# Patient Record
Sex: Female | Born: 1995 | Race: Black or African American | Hispanic: No | Marital: Single | Smoking: Never smoker
Health system: Southern US, Community
[De-identification: ages and names within clinical notes are randomized; demographics above are authoritative.]

## PROBLEM LIST (undated history)

## (undated) ENCOUNTER — Inpatient Hospital Stay (HOSPITAL_COMMUNITY): Payer: Self-pay

## (undated) DIAGNOSIS — T7840XA Allergy, unspecified, initial encounter: Secondary | ICD-10-CM

## (undated) HISTORY — DX: Allergy, unspecified, initial encounter: T78.40XA

---
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
## 2014-07-03 ENCOUNTER — Emergency Department (INDEPENDENT_AMBULATORY_CARE_PROVIDER_SITE_OTHER)
Admission: EM | Admit: 2014-07-03 | Discharge: 2014-07-03 | Disposition: A | Discharge status: Home / Self Care | Payer: 59 | Source: Home / Self Care | Attending: Family Medicine | Admitting: Family Medicine

## 2014-07-03 ENCOUNTER — Encounter (HOSPITAL_COMMUNITY): Payer: Self-pay | Admitting: Emergency Medicine

## 2014-07-03 DIAGNOSIS — J302 Other seasonal allergic rhinitis: Secondary | ICD-10-CM

## 2014-07-03 DIAGNOSIS — J040 Acute laryngitis: Secondary | ICD-10-CM | POA: Diagnosis not present

## 2014-07-03 DIAGNOSIS — I889 Nonspecific lymphadenitis, unspecified: Secondary | ICD-10-CM | POA: Diagnosis not present

## 2014-07-03 MED ORDER — AMOXICILLIN-POT CLAVULANATE 875-125 MG PO TABS: 1.0000 | ORAL_TABLET | Freq: Two times a day (BID) | ORAL | Status: DC

## 2014-07-03 NOTE — ED Notes (Signed)
C/o  Pain in left side neck.  States the following day having swelling and loss of voice.  Denies fever, n/v/d.   No relief with otc pain meds.   Symptoms present x 2 wks.

## 2014-07-03 NOTE — Discharge Instructions (Signed)
Allergic Rhinitis Allegra 60 mg twice a day for drainage Flonase or rhinocort nasal spray. Lots of oral liquids Allergic rhinitis is when the mucous membranes in the nose respond to allergens. Allergens are particles in the air that cause your body to have an allergic reaction. This causes you to release allergic antibodies. Through a chain of events, these eventually cause you to release histamine into the blood stream. Although meant to protect the body, it is this release of histamine that causes your discomfort, such as frequent sneezing, congestion, and an itchy, runny nose.  CAUSES  Seasonal allergic rhinitis (hay fever) is caused by pollen allergens that may come from grasses, trees, and weeds. Year-round allergic rhinitis (perennial allergic rhinitis) is caused by allergens such as house dust mites, pet dander, and mold spores.  SYMPTOMS   Nasal stuffiness (congestion).  Itchy, runny nose with sneezing and tearing of the eyes. DIAGNOSIS  Your health care provider can help you determine the allergen or allergens that trigger your symptoms. If you and your health care provider are unable to determine the allergen, skin or blood testing may be used. TREATMENT  Allergic rhinitis does not have a cure, but it can be controlled by:  Medicines and allergy shots (immunotherapy).  Avoiding the allergen. Hay fever may often be treated with antihistamines in pill or nasal spray forms. Antihistamines block the effects of histamine. There are over-the-counter medicines that may help with nasal congestion and swelling around the eyes. Check with your health care provider before taking or giving this medicine.  If avoiding the allergen or the medicine prescribed do not work, there are many new medicines your health care provider can prescribe. Stronger medicine may be used if initial measures are ineffective. Desensitizing injections can be used if medicine and avoidance does not work. Desensitization  is when a patient is given ongoing shots until the body becomes less sensitive to the allergen. Make sure you follow up with your health care provider if problems continue. HOME CARE INSTRUCTIONS It is not possible to completely avoid allergens, but you can reduce your symptoms by taking steps to limit your exposure to them. It helps to know exactly what you are allergic to so that you can avoid your specific triggers. SEEK MEDICAL CARE IF:   You have a fever.  You develop a cough that does not stop easily (persistent).  You have shortness of breath.  You start wheezing.  Symptoms interfere with normal daily activities. Document Released: 12/05/2000 Document Revised: 03/17/2013 Document Reviewed: 11/17/2012 [facility] Patient Information 2015 [place], [place]. This information is not intended to replace advice given to you by your health care provider. Make sure you discuss any questions you have with your health care provider.  Cervical Adenitis Heat Ibuprofen 400 mg every 6 hours as needed You have a swollen lymph gland in your neck. This commonly happens with Strep and virus infections, dental problems, insect bites, and injuries about the face, scalp, or neck. The lymph glands swell as the body fights the infection or heals the injury. Swelling and firmness typically lasts for several weeks after the infection or injury is healed. Rarely lymph glands can become swollen because of cancer or TB. Antibiotics are prescribed if there is evidence of an infection. Sometimes an infected lymph gland becomes filled with pus. This condition may require opening up the abscessed gland by draining it surgically. Most of the time infected glands return to normal within two weeks. Do not poke or squeeze the swollen  lymph nodes. That may keep them from shrinking back to their normal size. If the lymph gland is still swollen after 2 weeks, further medical evaluation is needed.  SEEK IMMEDIATE MEDICAL CARE IF:    You have difficulty swallowing or breathing, increased swelling, severe pain, or a high fever.  Document Released: 03/12/2005 Document Revised: 06/04/2011 Document Reviewed: 09/01/2006 [facility] Patient Information 2015 [place], [place]. This information is not intended to replace advice given to you by your health care provider. Make sure you discuss any questions you have with your health care provider.  Laryngitis Laryngitis is redness, soreness, and puffiness (inflammation) of the vocal cords. It causes hoarseness, cough, loss of voice, sore throat, and dry throat. It may be caused by:  Infection.  Too much smoking.  Too much talking or yelling.  Breathing in of toxic fumes.  Allergies.  A backup of acid from your stomach. HOME CARE  Drink enough fluids to keep your pee (urine) clear or pale yellow.  Rest until you no longer have problems or as told by your doctor.  Breathe in moist air.  Take all medicine as told by your doctor.  Do not smoke.  Talk as little as possible (this includes whispering).  Write on paper instead of talking until your voice is back to normal.  Follow up with your doctor if you have not improved after 10 days. GET HELP IF:   You have trouble breathing.  You cough up blood.  You have a fever that will not go away.  You have increasing pain.  You have trouble swallowing. MAKE SURE YOU:  Understand these instructions.  Will watch your condition.  Will get help right away if you are not doing well or get worse. Document Released: 03/01/2011 Document Revised: 06/04/2011 Document Reviewed: 03/01/2011 [facility] Patient Information 2015 [place], [place]. This information is not intended to replace advice given to you by your health care provider. Make sure you discuss any questions you have with your health care provider.  Laryngitis At the top of your windpipe is your voice box. It is the source of your voice. Inside your voice box are 2  bands of muscles called vocal cords. When you breathe, your vocal cords are relaxed and open so that air can get into the lungs. When you decide to say something, these cords come together and vibrate. The sound from these vibrations goes into your throat and comes out through your mouth as sound. Laryngitis is an inflammation of the vocal cords that causes hoarseness, cough, loss of voice, sore throat, and dry throat. Laryngitis can be temporary (acute) or long-term (chronic). Most cases of acute laryngitis improve with time.Chronic laryngitis lasts for more than 3 weeks. CAUSES Laryngitis can often be related to excessive smoking, talking, or yelling, as well as inhalation of toxic fumes and allergies. Acute laryngitis is usually caused by a viral infection, vocal strain, measles or mumps, or bacterial infections. Chronic laryngitis is usually caused by vocal cord strain, vocal cord injury, postnasal drip, growths on the vocal cords, or acid reflux. SYMPTOMS   Cough.  Sore throat.  Dry throat. RISK FACTORS  Respiratory infections.  Exposure to irritating substances, such as cigarette smoke, excessive amounts of alcohol, stomach acids, and workplace chemicals.  Voice trauma, such as vocal cord injury from shouting or speaking too loud. DIAGNOSIS  Your cargiver will perform a physical exam. During the physical exam, your caregiver will examine your throat. The most common sign of laryngitis is hoarseness. Laryngoscopy  may be necessary to confirm the diagnosis of this condition. This procedure allows your caregiver to look into the larynx. HOME CARE INSTRUCTIONS  Drink enough fluids to keep your urine clear or pale yellow.  Rest until you no longer have symptoms or as directed by your caregiver.  Breathe in moist air.  Take all medicine as directed by your caregiver.  Do not smoke.  Talk as little as possible (this includes whispering).  Write on paper instead of talking until your  voice is back to normal.  Follow up with your caregiver if your condition has not improved after 10 days. SEEK MEDICAL CARE IF:   You have trouble breathing.  You cough up blood.  You have persistent fever.  You have increasing pain.  You have difficulty swallowing. MAKE SURE YOU:  Understand these instructions.  Will watch your condition.  Will get help right away if you are not doing well or get worse. Document Released: 03/12/2005 Document Revised: 06/04/2011 Document Reviewed: 05/18/2010 [facility] Patient Information 2015 [place], [place]. This information is not intended to replace advice given to you by your health care provider. Make sure you discuss any questions you have with your health care provider.  Swollen Lymph Nodes The lymphatic system filters fluid from around cells. It is like a system of blood vessels. These channels carry lymph instead of blood. The lymphatic system is an important part of the immune (disease fighting) system. When people talk about "swollen glands in the neck," they are usually talking about swollen lymph nodes. The lymph nodes are like the little traps for infection. You and your caregiver may be able to feel lymph nodes, especially swollen nodes, in these common areas: the groin (inguinal area), armpits (axilla), and above the clavicle (supraclavicular). You may also feel them in the neck (cervical) and the back of the head just above the hairline (occipital). Swollen glands occur when there is any condition in which the body responds with an allergic type of reaction. For instance, the glands in the neck can become swollen from insect bites or any type of minor infection on the head. These are very noticeable in children with only minor problems. Lymph nodes may also become swollen when there is a tumor or problem with the lymphatic system, such as Hodgkin's disease. TREATMENT   Most swollen glands do not require treatment. They can be observed  (watched) for a short period of time, if your caregiver feels it is necessary. Most of the time, observation is not necessary.  Antibiotics (medicines that kill germs) may be prescribed by your caregiver. Your caregiver may prescribe these if he or she feels the swollen glands are due to a bacterial (germ) infection. Antibiotics are not used if the swollen glands are caused by a virus. HOME CARE INSTRUCTIONS   Take medications as directed by your caregiver. Only take over-the-counter or prescription medicines for pain, discomfort, or fever as directed by your caregiver. SEEK MEDICAL CARE IF:   If you begin to run a temperature greater than 102 F (38.9 C), or as your caregiver suggests. MAKE SURE YOU:   Understand these instructions.  Will watch your condition.  Will get help right away if you are not doing well or get worse. Document Released: 03/02/2002 Document Revised: 06/04/2011 Document Reviewed: 03/12/2005 [facility] Patient Information 2015 [place], [place]. This information is not intended to replace advice given to you by your health care provider. Make sure you discuss any questions you have with your health care  provider.

## 2014-07-03 NOTE — ED Provider Notes (Signed)
CSN: [id number]     Arrival date & time 07/03/14  1317 History   First MD Initiated Contact with Patient 07/03/14 1356     Chief Complaint  Patient presents with  . Adenopathy   (Consider location/radiation/quality/duration/timing/severity/associated sxs/prior Treatment) HPI Comments: 19 year old female complaining of left side of the neck being sore and tender for one and half weeks. Yesterday she developed laryngitis associated with PND. Denies earache, fever, chills, problems with swallowing or rotating her head.   History reviewed. No pertinent past medical history. Past Surgical History  Procedure Laterality Date  . Tonsilectomy, adenoidectomy, bilateral myringotomy and tubes  2006-7   History reviewed. No pertinent family history. History  Substance Use Topics  . Smoking status: Never Smoker   . Smokeless tobacco: Not on file  . Alcohol Use: No   OB History    Gravida Para Term Preterm AB TAB SAB Ectopic Multiple Living   0              Review of Systems  Constitutional: Positive for activity change. Negative for fever and fatigue.  HENT: Positive for congestion, postnasal drip and rhinorrhea.   Respiratory: Negative for cough and shortness of breath.   Cardiovascular: Negative.   Gastrointestinal: Negative.   Musculoskeletal: Positive for neck pain.  Hematological: Positive for adenopathy.    Allergies  Review of patient's allergies indicates no known allergies.  Home Medications   Prior to Admission medications   Not on File   BP 121/78 mmHg  Pulse 102  Temp(Src) 98.5 F (36.9 C) (Oral)  Resp 12  SpO2 98%  LMP 06/25/2014 Physical Exam  Constitutional: She is oriented to person, place, and time. She appears well-developed and well-nourished. No distress.  HENT:  Mouth/Throat: No oropharyngeal exudate.  Bilat TM's nl OP with minor erythema, cobblestoning and moderate clear PND.  Eyes: Conjunctivae and EOM are normal.  Neck: Normal range of motion.   Solitary node over the L lateral cervical chain. Approx 3 cm, tender. Able to rotate head normally but with pain rotating to the left. Flexion full, intact. Nl swallowing. No posterior involvement. No erythema.  Cardiovascular: Normal rate, regular rhythm and normal heart sounds.   Pulmonary/Chest: Effort normal and breath sounds normal. No respiratory distress. She has no wheezes. She has no rales.  Lymphadenopathy:    She has cervical adenopathy.  Neurological: She is alert and oriented to person, place, and time.  Skin: Skin is warm and dry.  Nursing note and vitals reviewed.   ED Course  Procedures (including critical care time) Labs Review Labs Reviewed - No data to display  Imaging Review No results found.   MDM   1. Other seasonal allergic rhinitis   2. Laryngitis   3. Cervical lymphadenitis     Allegra 60 mg twice a day for drainage Flonase or rhinocort nasal spray. Lots of oral liquids Local heat Ibuprofen If worse or not improving start augmentin in next 2 days     Hayden Rasmussenavid Jammi Morrissette, NP 07/03/14 1514

## 2014-07-05 ENCOUNTER — Ambulatory Visit (INDEPENDENT_AMBULATORY_CARE_PROVIDER_SITE_OTHER): Payer: 59 | Admitting: Internal Medicine

## 2014-07-05 VITALS — BP 110/70 | HR 95 | Temp 98.2°F | Resp 24 | Ht 64.0 in | Wt 138.0 lb

## 2014-07-05 DIAGNOSIS — R590 Localized enlarged lymph nodes: Secondary | ICD-10-CM | POA: Diagnosis not present

## 2014-07-05 LAB — POCT CBC
GRANULOCYTE PERCENT: 52.1 % (ref 37–80)
HEMATOCRIT: 40.8 % (ref 37.7–47.9)
HEMOGLOBIN: 13.5 g/dL (ref 12.2–16.2)
Lymph, poc: 2.3 (ref 0.6–3.4)
MCH, POC: 29.5 pg (ref 27–31.2)
MCHC: 33 g/dL (ref 31.8–35.4)
MCV: 89.4 fL (ref 80–97)
MID (cbc): 0.7 (ref 0–0.9)
MPV: 8.2 fL (ref 0–99.8)
POC GRANULOCYTE: 3.3 (ref 2–6.9)
POC LYMPH PERCENT: 37.3 %L (ref 10–50)
POC MID %: 10.6 % (ref 0–12)
Platelet Count, POC: 199 10*3/uL (ref 142–424)
RBC: 4.56 M/uL (ref 4.04–5.48)
RDW, POC: 12.3 %
WBC: 6.3 10*3/uL (ref 4.6–10.2)

## 2014-07-05 MED ORDER — IBUPROFEN 200 MG PO TABS: 200.0000 mg | ORAL_TABLET | Freq: Four times a day (QID) | ORAL | Status: DC | PRN

## 2014-07-05 NOTE — Patient Instructions (Signed)
Take and completet the augmentin Return in 10 days to re-eval. If you still have the enlarged lymph node you will need to be referred to ENT for further diagnostic evaluation to include a biopsy of these lymph node.

## 2014-07-05 NOTE — Progress Notes (Signed)
Subjective:    Patient ID: Kimberly Callahan, female    DOB: [birth year], 19 y.o.   MRN: [id number]  HPI CC; Swollen lymph node  HPI: 10 day history of swelling of the lymph node of the right side of the neck.  Lymph node is painful enlarged tender pain is 5/10 constant for 10 days.  [facility] and had a negitive strep screen and and got a rx for Augmentin but did not start it yet and is concerned because for the pain.  No sore throat, no fever, no weight loss, no nite sweats, no cough or sob, no recent trave;l, no rashes or exposures or bites, no difficulty speaking or swallowing.  Review of Systems  Constitutional: Negative for fever, chills, diaphoresis, activity change, appetite change, fatigue and unexpected weight change.  HENT: Positive for congestion and ear pain. Negative for ear discharge, facial swelling, rhinorrhea, sinus pressure and sore throat.   Respiratory: Negative for cough.        Objective:   Physical Exam  Nursing note and vitals reviewed.  BP 110/70 mmHg  Pulse 95  Temp(Src) 98.2 F (36.8 C) (Oral)  Resp 24  Ht  (1.626 m)  Wt 138 lb (62.596 kg)  BMI 23.68 kg/m2  SpO2 98%  LMP 06/25/2014  General Appearance:    Alert, cooperative, no distress, appears stated age  Head:    Normocephalic, without obvious abnormality, atraumatic  Eyes:    PERRL, conjunctiva/corneas clear, EOM's intact, fundi    benign, both eyes  Ears:    Normal TM's and external ear canals, both ears  Nose:   Nares normal, septum midline, mucosa normal, no drainage    or sinus tenderness  Throat:   Lips, mucosa, and tongue normal; teeth and gums normal  Neck:   Supple, symmetrical, trachea midline,   thyroid:  no enlargement/tenderness/nodules; no carotid   bruit or JVD, she has a 3 cm tender lymph node on the right cervical chain of lymph nodes. It is tender but has no redness over it   Back:     Symmetric, no curvature, ROM normal, no CVA tenderness  Lungs:      Clear to auscultation bilaterally, respirations unlabored  Chest Wall:    No tenderness or deformity   Heart:    Regular rate and rhythm, S1 and S2 normal, no murmur, rub   or gallop     Abdomen:     Soft, non-tender, bowel sounds active all four quadrants,    no masses, no organomegaly        Extremities:   Extremities normal, atraumatic, no cyanosis or edema  Pulses:   2+ and symmetric all extremities  Skin:   Skin color, texture, turgor normal, no rashes or lesions  Lymph nodes:   supraclavicular, and axillary nodes normal has  Anterior cervical right sided lyphadenopathy with a 3 cm lymph node palpable  Neurologic:   CNII-XII intact, normal strength, sensation and reflexes    throughout    Results for orders placed or performed in visit on 07/05/14  POCT CBC  Result Value Ref Range   WBC 6.3 4.6 - 10.2 K/uL   Lymph, poc 2.3 0.6 - 3.4   POC LYMPH PERCENT 37.3 10 - 50 %L   MID (cbc) 0.7 0 - 0.9   POC MID % 10.6 0 - 12 %M   POC Granulocyte 3.3 2 - 6.9   Granulocyte percent 52.1 37 - 80 %G  RBC 4.56 4.04 - 5.48 M/uL   Hemoglobin 13.5 12.2 - 16.2 g/dL   HCT, POC 16.140.8 09.637.7 - 47.9 %   MCV 89.4 80 - 97 fL   MCH, POC 29.5 27 - 31.2 pg   MCHC 33.0 31.8 - 35.4 g/dL   RDW, POC 04.512.3 %   Platelet Count, POC 199 142 - 424 K/uL   MPV 8.2 0 - 99.8 fL  normal cbc    Assessment & Plan:  1. Cervical lymphadenopathy 2. Painful lymph node Cbc, start and complete a 10 day course of augmentin. Rest. Ibu[profen as directed for pain. reeval in 10 days to determine improvement or need fo rfurther eval.

## 2014-07-07 ENCOUNTER — Telehealth: Payer: Self-pay | Admitting: Radiology

## 2014-07-08 ENCOUNTER — Ambulatory Visit (INDEPENDENT_AMBULATORY_CARE_PROVIDER_SITE_OTHER): Payer: 59 | Admitting: Family Medicine

## 2014-07-08 VITALS — BP 98/72 | HR 123 | Temp 98.1°F | Resp 16 | Ht 64.0 in | Wt 136.0 lb

## 2014-07-08 DIAGNOSIS — B2790 Infectious mononucleosis, unspecified without complication: Secondary | ICD-10-CM | POA: Diagnosis not present

## 2014-07-08 DIAGNOSIS — R599 Enlarged lymph nodes, unspecified: Secondary | ICD-10-CM

## 2014-07-08 DIAGNOSIS — R590 Localized enlarged lymph nodes: Secondary | ICD-10-CM

## 2014-07-08 DIAGNOSIS — L509 Urticaria, unspecified: Secondary | ICD-10-CM

## 2014-07-08 MED ORDER — PREDNISONE 20 MG PO TABS: 20.0000 mg | ORAL_TABLET | Freq: Every day | ORAL | Status: AC

## 2014-07-08 MED ORDER — EPINEPHRINE 0.3 MG/0.3ML IJ SOAJ: 0.3000 mg | Freq: Once | INTRAMUSCULAR | Status: DC

## 2014-07-08 NOTE — Patient Instructions (Addendum)
-   You may take benadryl for itching and rash, ranitidine with Zyrtec for rash.   Infectious Mononucleosis Infectious mononucleosis (mono) is a common germ (viral) infection in children, teenagers, and young adults.  CAUSES  Mono is an infection caused by the Malachi CarlEpstein Barr virus. The virus is spread by close personal contact with someone who has the infection. It can be passed by contact with your saliva through things such as kissing or sharing drinking glasses. Sometimes, the infection can be spread from someone who does not appear sick but still spreads the virus (asymptomatic carrier state).  SYMPTOMS  The most common symptoms of Mono are:  Sore throat.  Headache.  Fatigue.  Muscle aches.  Swollen glands.  Fever.  Poor appetite.  Enlarged liver or spleen. The less common symptoms can include:  Rash.  Feeling sick to your stomach (nauseous).  Abdominal pain. DIAGNOSIS  Mono is diagnosed by a blood test.  TREATMENT  Treatment of mono is usually at home. There is no medicine that cures this virus. Sometimes hospital treatment is needed in severe cases. Steroid medicine sometimes is needed if the swelling in the throat causes breathing or swallowing problems.  HOME CARE INSTRUCTIONS   Drink enough fluids to keep your urine clear or pale yellow.  Eat soft foods. Cool foods like popsicles or ice cream can soothe a sore throat.  Only take over-the-counter or prescription medicines for pain, discomfort, or fever as directed by your caregiver. Children under 918 years of age should not take aspirin.  Gargle salt water. This may help relieve your sore throat. Put 1 teaspoon (tsp) of salt in 1 cup of warm water. Sucking on hard candy may also help.  Rest as needed.  Start regular activities gradually after the fever is gone. Be sure to rest when tired.  Avoid strenuous exercise or contact sports until your caregiver says it is okay. The liver and spleen could be seriously  injured.  Avoid sharing drinking glasses or kissing until your caregiver tells you that you are no longer contagious. SEEK MEDICAL CARE IF:   Your fever is not gone after 7 days.  Your activity level is not back to normal after 2 weeks.  You have yellow coloring to eyes and skin (jaundice). SEEK IMMEDIATE MEDICAL CARE IF:   You have severe pain in the abdomen or shoulder.  You have trouble swallowing or drooling.  You have trouble breathing.  You develop a stiff neck.  You develop a severe headache.  You cannot stop throwing up (vomiting).  You have convulsions.  You are confused.  You have trouble with balance.  You develop signs of body fluid loss (dehydration):  Weakness.  Sunken eyes.  Pale skin.  Dry mouth.  Rapid breathing or pulse. MAKE SURE YOU:   Understand these instructions.  Will watch your condition.  Will get help right away if you are not doing well or get worse. Document Released: 03/09/2000 Document Revised: 06/04/2011 Document Reviewed: 01/06/2008 [facility] Patient Information 2015 [place], [place]. This information is not intended to replace advice given to you by your health care provider. Make sure you discuss any questions you have with your health care provider.

## 2014-07-08 NOTE — Progress Notes (Addendum)
    MRN: [id number] DOB: 09-25-1995  Subjective:   Kimberly Callahan is a 19 y.o. female presenting for chief complaint of Allergic Reaction  Reports 1 day history of hives that started after taking 1 dose of Augmentin. Patient was seen at [facility] on 07/05/2014, started on Augmentin for cervical lymphadenopathy. Today, she reports 2 week history of progressively swollen left sided cervical lymph node, fatigue. Has tried Excedrin for headache relief. Denies fevers, night sweats, weight loss, sore throat, sinus pain, ear pain, chest pain, shob, wheezing, n/v, abdominal pain, facial swelling, tongue swelling. Denies any other aggravating or relieving factors, no other questions or concerns.  Kimberly Callahan has a current medication list which includes the following prescription(s): amoxicillin-clavulanate. She is allergic to augmentin.  Kimberly Callahan  has a past medical history of Allergy. Also  has past surgical history that includes Tonsilectomy, adenoidectomy, bilateral myringotomy and tubes (2006-7).  ROS As in subjective.  Objective:   Vitals: BP 98/72 mmHg  Pulse 123  Temp(Src) 98.1 F (36.7 C)  Resp 16  Ht 5\' 4"  (1.626 m)  Wt 136 lb (61.689 kg)  BMI 23.33 kg/m2  SpO2 100%  LMP 06/25/2014  Physical Exam  Constitutional: She is oriented to person, place, and time.  Neck: Normal range of motion. Neck supple.  Cardiovascular: Regular rhythm and intact distal pulses.  Exam reveals no gallop and no friction rub.   No murmur heard. Pulmonary/Chest: No stridor. No respiratory distress. She has no wheezes. She has no rales. She exhibits no tenderness.  Abdominal: Soft. Bowel sounds are normal. She exhibits no distension and no mass. There is no tenderness.  No hepatosplenomegaly.  Lymphadenopathy:    She has cervical adenopathy.  Neurological: She is alert and oriented to person, place, and time.  Skin: Skin is warm and dry. Rash (diffuse urticaria over arms, shoulders, thighs, back  and torso) noted. No erythema. No pallor.   addnd by J Copland: She has one tender left posterior cervical node, approx 1cm diameter  Otherwise no cervical nodes, axillary nodes, supraclavicular or inguinal nodes are palpable.  Bilateral TM wnl, oropharynx normal.  No angioedema. PEERL,EOMI.   She looks well and is NOT tachycardic to my exam with rate of approx 100 BPM Also called her in the evening to check on her- I am rx an epipen for her to have on hand just in case of allergic reaction.  She reports that she has used benadryl and her rash/ itching is currently quiet.  She actually already has an epipen at home from previous incident   Pulse Readings from Last 3 Encounters:  07/08/14 123  07/05/14 95  07/03/14 102     Assessment and Plan :   1. Posterior cervical lymphadenopathy 2. Infectious mononucleosis - Labs pending, clinical diagnosis of EBV/Mono. Advised Tylenol and ibuprofen for supportive care, fluids and rest, no contact sports - If EBV panel is negative, will obtain U/S of cervical lymph node Did give her a short course of prednisone for her rash  3. Urticaria - Questionable allergic reaction to Augmentin, consider morbilliform rash due to EBV infection - Will recheck after lab results Prednisone, OTC ranitidine and zyrtec, benadryl as needed   Wallis BambergMario Amjad Fikes, PA-C [facility] [phone] 07/08/2014 8:18 AM

## 2014-07-08 NOTE — Addendum Note (Signed)
Addended by: Abbe AmsterdamOPLAND, Rogan Wigley C on: 07/08/2014 08:09 PM   Modules accepted: Orders, Medications

## 2014-07-08 NOTE — Addendum Note (Signed)
Addended by: Abbe AmsterdamOPLAND, Evie Crumpler C on: 07/08/2014 08:22 PM   Modules accepted: Orders

## 2014-07-09 LAB — EPSTEIN-BARR VIRUS VCA ANTIBODY PANEL
EBV EA IgG: 6.1 U/mL (ref <9.0)
EBV NA IGG: 46.9 U/mL — AB (ref <18.0)
EBV VCA IgG: 276 U/mL — ABNORMAL HIGH (ref <18.0)
EBV VCA IgM: <10 U/mL (ref <36.0)

## 2015-02-09 ENCOUNTER — Ambulatory Visit (INDEPENDENT_AMBULATORY_CARE_PROVIDER_SITE_OTHER): Payer: 59

## 2015-02-09 ENCOUNTER — Ambulatory Visit (INDEPENDENT_AMBULATORY_CARE_PROVIDER_SITE_OTHER): Payer: 59 | Admitting: Family Medicine

## 2015-02-09 VITALS — BP 110/62 | HR 81 | Temp 98.4°F | Resp 14 | Ht 64.0 in | Wt 133.0 lb

## 2015-02-09 DIAGNOSIS — S8002XA Contusion of left knee, initial encounter: Secondary | ICD-10-CM | POA: Diagnosis not present

## 2015-02-09 DIAGNOSIS — M25462 Effusion, left knee: Secondary | ICD-10-CM | POA: Diagnosis not present

## 2015-02-09 DIAGNOSIS — M25562 Pain in left knee: Secondary | ICD-10-CM | POA: Diagnosis not present

## 2015-02-09 DIAGNOSIS — T148XXA Other injury of unspecified body region, initial encounter: Secondary | ICD-10-CM

## 2015-02-09 MED ORDER — NAPROXEN 500 MG PO TABS: 500.0000 mg | ORAL_TABLET | Freq: Two times a day (BID) | ORAL | Status: DC

## 2015-02-09 NOTE — Patient Instructions (Signed)
Knee Pain  Knee pain is a very common symptom and can have many causes. Knee pain often goes away when you follow your health care provider's instructions for relieving pain and discomfort at home. However, knee pain can develop into a condition that needs treatment. Some conditions may include:   Arthritis caused by wear and tear (osteoarthritis).   Arthritis caused by swelling and irritation (rheumatoid arthritis or gout).   A cyst or growth in your knee.   An infection in your knee joint.   An injury that will not heal.   Damage, swelling, or irritation of the tissues that support your knee (torn ligaments or tendinitis).  If your knee pain continues, additional tests may be ordered to diagnose your condition. Tests may include X-rays or other imaging studies of your knee. You may also need to have fluid removed from your knee. Treatment for ongoing knee pain depends on the cause, but treatment may include:   Medicines to relieve pain or swelling.   Steroid injections in your knee.   Physical therapy.   Surgery.  HOME CARE INSTRUCTIONS   Take medicines only as directed by your health care provider.   Rest your knee and keep it raised (elevated) while you are resting.   Do not do things that cause or worsen pain.   Avoid high-impact activities or exercises, such as running, jumping rope, or doing jumping jacks.   Apply ice to the knee area:    Put ice in a plastic bag.    Place a towel between your skin and the bag.    Leave the ice on for 20 minutes, 2-3 times a day.   Ask your health care provider if you should wear an elastic knee support.   Keep a pillow under your knee when you sleep.   Lose weight if you are overweight. Extra weight can put pressure on your knee.   Do not use any tobacco products, including cigarettes, chewing tobacco, or electronic cigarettes. If you need help quitting, ask your health care provider. Smoking may slow the healing of any bone and joint problems that you may  have.  SEEK MEDICAL CARE IF:   Your knee pain continues, changes, or gets worse.   You have a fever along with knee pain.   Your knee buckles or locks up.   Your knee becomes more swollen.  SEEK IMMEDIATE MEDICAL CARE IF:    Your knee joint feels hot to the touch.   You have chest pain or trouble breathing.     This information is not intended to replace advice given to you by your health care provider. Make sure you discuss any questions you have with your health care provider.     Document Released: 01/07/2007 Document Revised: 04/02/2014 Document Reviewed: 10/26/2013  Elsevier Interactive Patient Education 2016 Elsevier Inc.

## 2015-02-09 NOTE — Progress Notes (Signed)
Chief Complaint:  Chief Complaint  Patient presents with  . Knee Pain    Left knee, swelling, x 3 days    HPI: Kimberly Callahan is a 19 y.o. female who reports to [facility] today complaining of 3 day history of left knee pain after colliding with another player during flag football ( apparently for girls it is called powder puff football) . Direct knee contact. + swelling, ecchymosis, moderate pain. 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 Has not tried anything for this. She doe snot feel unstable when she walks, just pain with swelling  Past Medical History  Diagnosis Date  . Allergy    Past Surgical History  Procedure Laterality Date  . Tonsilectomy, adenoidectomy, bilateral myringotomy and tubes  2006-7   Social History   Social History  . Marital Status: Married    Spouse Name: N/A  . Number of Children: N/A  . Years of Education: N/A   Social History Main Topics  . Smoking status: Never Smoker   . Smokeless tobacco: Never Used  . Alcohol Use: No  . Drug Use: No  . Sexual Activity: Yes    Birth Control/ Protection: Condom   Other Topics Concern  . None   Social History Narrative   Family History  Problem Relation Age of Onset  . Hypertension Mother    Allergies  Allergen Reactions  . Augmentin [Amoxicillin-Pot Clavulanate] Hives   Prior to Admission medications   Medication Sig Start Date End Date Taking? Authorizing Provider  EPINEPHrine 0.3 mg/0.3 mL IJ SOAJ injection Inject 0.3 mLs (0.3 mg total) into the muscle once. 07/08/14  Yes Gwenlyn Found Copland, MD     ROS: The patient denies fevers, chills, night sweats, unintentional weight loss, chest pain, palpitations, wheezing, dyspnea on exertion, nausea, vomiting, abdominal pain, dysuria, hematuria, melena, numbness, weakness, or tingling.   All other systems have been reviewed and were otherwise negative with the exception of those mentioned in the HPI and as above.    PHYSICAL  EXAM: Filed Vitals:   02/09/15 1107  BP: 110/62  Pulse: 81  Temp: 98.4 F (36.9 C)  Resp: 14   Body mass index is 22.82 kg/(m^2).   General: Alert, no acute distress HEENT:  Normocephalic, atraumatic, oropharynx patent. EOMI, PERRLA Cardiovascular:  Regular rate and rhythm, no rubs murmurs or gallops.  No Carotid bruits, radial pulse intact. No pedal edema.  Respiratory: Clear to auscultation bilaterally.  No wheezes, rales, or rhonchi.  No cyanosis, no use of accessory musculature Abdominal: No organomegaly, abdomen is soft and non-tender, positive bowel sounds. No masses. Skin: No rashes. Neurologic: Facial musculature symmetric. Psychiatric: Patient acts appropriately throughout our interaction. Lymphatic: No cervical or submandibular lymphadenopathy Musculoskeletal: Gait intact.  + left knee medial apsect ecchymosis and lateral knee ballotment Diffuse tenderness Decrease AROM, full PROM 5/5 strength, 2/2 DTRs ankle ,  Neg Lachman, Neg medial jt line tenderness, neg McMurray L spine normal ROM,  Straight leg negative Hip normal ROM     LABS: Results for orders placed or performed in visit on 07/08/14  Epstein-Barr virus VCA antibody panel  Result Value Ref Range   EBV VCA IgG 276.0 (H) <18.0 U/mL   EBV VCA IgM <10.0 <36.0 U/mL   EBV EA IgG 6.1 <9.0 U/mL   EBV NA IgG 46.9 (H) <18.0 U/mL     EKG/XRAY:   Primary read interpreted by Dr. Conley Rolls at [facility]. Neg for fracture or dislocation  ASSESSMENT/PLAN: Encounter Diagnoses  Name Primary?  . Left knee pain Yes  . Anterior knee pain, left   . Sprain and strain    Knee contusion with effusion, but minimal I do no think the risk to benefits ratio is warranted to aspirate and inject with steroids at this time, she states the swellinghas gone down significantly with rest.  Possible superior patellar bursitis as we;; Rx Naproxen Rx Hinge knee brace for comfort Fu prn   Gross sideeffects, risk and benefits, and  alternatives of medications d/w patient. Patient is aware that all medications have potential sideeffects and we are unable to predict every sideeffect or drug-drug interaction that may occur.  Ritvik Mczeal DO  02/11/2015 3:55 PM

## 2015-03-07 ENCOUNTER — Ambulatory Visit (INDEPENDENT_AMBULATORY_CARE_PROVIDER_SITE_OTHER): Payer: 59 | Admitting: Internal Medicine

## 2015-03-07 VITALS — BP 100/76 | HR 85 | Temp 99.2°F | Resp 17 | Ht 64.0 in | Wt 133.8 lb

## 2015-03-07 DIAGNOSIS — R300 Dysuria: Secondary | ICD-10-CM | POA: Diagnosis not present

## 2015-03-07 DIAGNOSIS — N898 Other specified noninflammatory disorders of vagina: Secondary | ICD-10-CM | POA: Diagnosis not present

## 2015-03-07 DIAGNOSIS — M25462 Effusion, left knee: Secondary | ICD-10-CM | POA: Insufficient documentation

## 2015-03-07 LAB — POCT URINALYSIS DIP (MANUAL ENTRY)
BILIRUBIN UA: NEGATIVE
Bilirubin, UA: NEGATIVE
Glucose, UA: NEGATIVE
Leukocytes, UA: NEGATIVE
Nitrite, UA: NEGATIVE
PH UA: 5.5
RBC UA: NEGATIVE
Urobilinogen, UA: 1

## 2015-03-07 LAB — POCT WET + KOH PREP: Trich by wet prep: ABSENT

## 2015-03-07 LAB — POC MICROSCOPIC URINALYSIS (UMFC)

## 2015-03-07 MED ORDER — AZITHROMYCIN 500 MG PO TABS: 1000.0000 mg | ORAL_TABLET | Freq: Once | ORAL | Status: DC

## 2015-03-07 NOTE — Patient Instructions (Signed)
Get plenty of rest and drink at least 64 ounces of water daily.  I will contact you with your lab results as soon as they are available.   If you have not heard from me in 2 weeks, please contact me.  The fastest way to get your results is to register for My Chart (see the instructions on the last page of this printout).   

## 2015-03-07 NOTE — Progress Notes (Signed)
Patient ID: Kimberly Callahan, female    DOB: [birth year], 19 y.o.   MRN: [id number]  PCP: Abbe AmsterdamOPLAND,JESSICA, MD  Subjective:   Chief Complaint  Patient presents with  . Urinary Tract Infection    HPI Presents for evaluation of possible UTI.  Symptoms x 1 week. Burning and stinging before she urinates. Resolves as the urine drains. Associated with increased urgency and frequency, but no hematuria. Lower pelvic pain. No back pain. No nausea or vomiting. No fever/chills. Does have atypical vaginal discharge x 5 days as well. 1 current sexual partner x 12 months. Unsure if he has other partners.  Review of Systems As above.    Patient Active Problem List   Diagnosis Date Noted  . Swelling of joint, knee, left 03/07/2015     Prior to Admission medications   Medication Sig Start Date End Date Taking? Authorizing Provider  EPINEPHrine 0.3 mg/0.3 mL IJ SOAJ injection Inject 0.3 mLs (0.3 mg total) into the muscle once. Patient not taking: Reported on 03/07/2015 07/08/14   Pearline CablesJessica C Copland, MD  naproxen (NAPROSYN) 500 MG tablet Take 1 tablet (500 mg total) by mouth 2 (two) times daily with a meal. No other NSAIDs Patient not taking: Reported on 03/07/2015 02/09/15   Thao P Le, DO     Allergies  Allergen Reactions  . Augmentin [Amoxicillin-Pot Clavulanate] Hives       Objective:  Physical Exam  Constitutional: She is oriented to person, place, and time. Vital signs are normal. She appears well-developed and well-nourished. She is active and cooperative. No distress.  BP 100/76 mmHg  Pulse 85  Temp(Src) 99.2 F (37.3 C) (Oral)  Resp 17  Ht 5\' 4"  (1.626 m)  Wt 133 lb 12.8 oz (60.691 kg)  BMI 22.96 kg/m2  SpO2 99%  LMP 03/07/2015  HENT:  Head: Normocephalic and atraumatic.  Right Ear: Hearing normal.  Left Ear: Hearing normal.  Eyes: Conjunctivae are normal. No scleral icterus.  Neck: Normal range of motion. Neck supple. No thyromegaly present.  Cardiovascular: Normal  rate, regular rhythm and normal heart sounds.   Pulses:      Radial pulses are 2+ on the right side, and 2+ on the left side.  Pulmonary/Chest: Effort normal and breath sounds normal.  Abdominal: Hernia confirmed negative in the right inguinal area and confirmed negative in the left inguinal area.  Genitourinary: Uterus normal. Pelvic exam was performed with patient supine. No labial fusion. There is no rash, tenderness, lesion or injury on the right labia. There is no rash, tenderness, lesion or injury on the left labia. Cervix exhibits no motion tenderness, no discharge and no friability. Right adnexum displays no mass, no tenderness and no fullness. Left adnexum displays no mass, no tenderness and no fullness. No erythema, tenderness or bleeding in the vagina. No foreign body around the vagina. No signs of injury around the vagina. Vaginal discharge found.  Lymphadenopathy:       Head (right side): No tonsillar, no preauricular, no posterior auricular and no occipital adenopathy present.       Head (left side): No tonsillar, no preauricular, no posterior auricular and no occipital adenopathy present.    She has no cervical adenopathy.       Right: No inguinal and no supraclavicular adenopathy present.       Left: No inguinal and no supraclavicular adenopathy present.  Neurological: She is alert and oriented to person, place, and time. No sensory deficit.  Skin: Skin is warm, dry and  intact. No rash noted. No cyanosis or erythema. Nails show no clubbing.  Psychiatric: She has a normal mood and affect.       Results for orders placed or performed in visit on 03/07/15  POCT Microscopic Urinalysis ([facility])  Result Value Ref Range   WBC,UR,HPF,POC Moderate (A) None WBC/hpf   RBC,UR,HPF,POC None None RBC/hpf   Bacteria Few (A) None, Too numerous to count   Mucus Present (A) Absent   Epithelial Cells, UR Per Microscopy Few (A) None, Too numerous to count cells/hpf  POCT urinalysis dipstick    Result Value Ref Range   Color, UA yellow yellow   Clarity, UA clear clear   Glucose, UA negative negative   Bilirubin, UA negative negative   Ketones, POC UA negative negative   Spec Grav, UA >=1.030    Blood, UA negative negative   pH, UA 5.5    Protein Ur, POC =30 (A) negative   Urobilinogen, UA 1.0    Nitrite, UA Negative Negative   Leukocytes, UA Negative Negative  POCT Wet + KOH Prep  Result Value Ref Range   Yeast by KOH Present Present, Absent   Yeast by wet prep Present Present, Absent   WBC by wet prep Many (A) None, Few, Too numerous to count   Clue Cells Wet Prep HPF POC Few (A) None, Too numerous to count   Trich by wet prep Absent Present, Absent   Bacteria Wet Prep HPF POC Few None, Few, Too numerous to count   Epithelial Cells By [employer] Pref ([facility]) Moderate (A) None, Few, Too numerous to count   RBC,UR,HPF,POC None None RBC/hpf       Assessment & Plan:   1. Dysuria 2. Vaginal discharge Indeterminate UA and wet prep, but concerning for Chlamydia. Treat as such, while UCx and GC/CT probe are pending. Advise against sexual activity until results are reviewed (and partner treated if needed). - POCT Microscopic Urinalysis ([facility]) - POCT urinalysis dipstick - POCT Wet + KOH Prep - Urine culture - GC/Chlamydia Probe Amp - azithromycin (ZITHROMAX) 500 MG tablet; Take 2 tablets (1,000 mg total) by mouth once.  Dispense: 2 tablet; Refill: 0   Discussed with Dr. Merla Riches.  Fernande Bras, PA-C Physician Assistant-Certified [facility] I have completed the patient encounter in its entirety as documented by the scribe, with editing by me where necessary. Robert P. Merla Riches, M.D.

## 2015-03-09 LAB — URINE CULTURE
Colony Count: NO GROWTH
ORGANISM ID, BACTERIA: NO GROWTH

## 2015-03-09 LAB — GC/CHLAMYDIA PROBE AMP
CT PROBE, AMP APTIMA: NOT DETECTED
GC PROBE AMP APTIMA: NOT DETECTED

## 2015-04-15 ENCOUNTER — Encounter: Payer: Self-pay | Admitting: Family Medicine

## 2015-04-20 ENCOUNTER — Encounter: Payer: Self-pay | Admitting: Family Medicine

## 2015-07-02 ENCOUNTER — Ambulatory Visit (INDEPENDENT_AMBULATORY_CARE_PROVIDER_SITE_OTHER): Payer: 59 | Admitting: Family Medicine

## 2015-07-02 VITALS — BP 110/66 | HR 71 | Temp 97.6°F | Resp 16 | Ht 65.5 in | Wt 131.6 lb

## 2015-07-02 DIAGNOSIS — R300 Dysuria: Secondary | ICD-10-CM

## 2015-07-02 DIAGNOSIS — N926 Irregular menstruation, unspecified: Secondary | ICD-10-CM

## 2015-07-02 DIAGNOSIS — Z3046 Encounter for surveillance of implantable subdermal contraceptive: Secondary | ICD-10-CM

## 2015-07-02 DIAGNOSIS — R319 Hematuria, unspecified: Secondary | ICD-10-CM

## 2015-07-02 DIAGNOSIS — E049 Nontoxic goiter, unspecified: Secondary | ICD-10-CM

## 2015-07-02 LAB — POCT CBC
Granulocyte percent: 65.9 %G (ref 37–80)
HEMATOCRIT: 39.2 % (ref 37.7–47.9)
HEMOGLOBIN: 13.7 g/dL (ref 12.2–16.2)
LYMPH, POC: 2.1 (ref 0.6–3.4)
MCH, POC: 32.4 pg — AB (ref 27–31.2)
MCHC: 35 g/dL (ref 31.8–35.4)
MCV: 92.6 fL (ref 80–97)
MID (cbc): 0.6 (ref 0–0.9)
MPV: 8.4 fL (ref 0–99.8)
POC GRANULOCYTE: 5.2 (ref 2–6.9)
POC LYMPH %: 26.5 % (ref 10–50)
POC MID %: 7.6 %M (ref 0–12)
Platelet Count, POC: 223 10*3/uL (ref 142–424)
RBC: 4.24 M/uL (ref 4.04–5.48)
RDW, POC: 12.6 %
WBC: 7.9 10*3/uL (ref 4.6–10.2)

## 2015-07-02 LAB — THYROID PANEL WITH TSH
FREE THYROXINE INDEX: 2.5 (ref 1.4–3.8)
T3 Uptake: 33 % (ref 22–35)
T4 TOTAL: 7.5 ug/dL (ref 4.5–12.0)
TSH: 2.04 m[IU]/L

## 2015-07-02 LAB — POCT WET + KOH PREP
TRICH BY WET PREP: ABSENT
YEAST BY WET PREP: ABSENT
Yeast by KOH: ABSENT

## 2015-07-02 LAB — POC MICROSCOPIC URINALYSIS (UMFC)

## 2015-07-02 LAB — POCT URINALYSIS DIP (MANUAL ENTRY)
BILIRUBIN UA: NEGATIVE
BILIRUBIN UA: NEGATIVE
Glucose, UA: NEGATIVE
Nitrite, UA: NEGATIVE
PH UA: 6
Protein Ur, POC: 100 — AB
SPEC GRAV UA: 1.025
Urobilinogen, UA: 1

## 2015-07-02 MED ORDER — PHENAZOPYRIDINE HCL 200 MG PO TABS: 200.0000 mg | ORAL_TABLET | Freq: Three times a day (TID) | ORAL | Status: DC | PRN

## 2015-07-02 MED ORDER — SULFAMETHOXAZOLE-TRIMETHOPRIM 800-160 MG PO TABS: 1.0000 | ORAL_TABLET | Freq: Two times a day (BID) | ORAL | Status: DC

## 2015-07-02 NOTE — Progress Notes (Addendum)
Subjective:  By signing my name below, I, Raven Small, attest that this documentation has been prepared under the direction and in the presence of Norberto SorensonEva Ronson Hagins, MD.  Electronically Signed: Andrew Auaven Small, ED Scribe. 07/02/2015. 9:17 AM.   Patient ID: Kimberly Callahan, female    DOB: Sep 23, 1995, 20 y.o.   MRN: [id number]  HPI   Chief Complaint  Patient presents with  . Dysuria  . Urinary Frequency    HPI Comments: Kimberly Callahan is a 20 y.o. female who presents to the [facility] complaining of dysuria for 1 week with associated hematuria, urinary frequency and urinary retention. Pt states she was treated for a UTI about 1-2 wks ago at [facility] [address] [facility] but is unable to recall medication she prescribed at that time. She states symptoms had improved somewhat with medication but symptoms returned and worsened as soon as abx course was completed  - is on some antibiotic that she reports was qd x 3d.Marland Kitchen. LMP-a few day ago. She states most recent menses lasted 3 weeks but reports hx of irregular menses. She is [facility]. Her last pap smear was 10/2014 and was also started on nexplanon at that time. She denies fever, chills, nausea, abdominal pain, changes in bowels, emesis and vaginal discharge.    Patient Active Problem List   Diagnosis Date Noted  . Swelling of joint, knee, left 03/07/2015   Past Medical History  Diagnosis Date  . Allergy    Past Surgical History  Procedure Laterality Date  . Tonsilectomy, adenoidectomy, bilateral myringotomy and tubes  2006-7   Allergies  Allergen Reactions  . Augmentin [Amoxicillin-Pot Clavulanate] Hives   Prior to Admission medications   Not on File   Social History   Social History  . Marital Status: Single    Spouse Name: n/a  . Number of Children: 0  . Years of Education: college   Occupational History  . optical lab tech     [employer]   Social History Main Topics  . Smoking  status: Never Smoker   . Smokeless tobacco: Never Used  . Alcohol Use: No  . Drug Use: No  . Sexual Activity:    Partners: Male    Birth Control/ Protection: Condom   Other Topics Concern  . Not on file   Social History Narrative   Lives alone.   Her family lives nearby.   Review of Systems  Constitutional: Negative for fever and chills.  Gastrointestinal: Negative for nausea, vomiting, abdominal pain, diarrhea and constipation.  Genitourinary: Positive for dysuria, frequency, hematuria, difficulty urinating and menstrual problem. Negative for flank pain, vaginal bleeding, vaginal discharge and vaginal pain.  Skin: Negative for rash.  Allergic/Immunologic: Negative for immunocompromised state.  Psychiatric/Behavioral: Negative for sleep disturbance.    Objective:   Physical Exam  Constitutional: She is oriented to person, place, and time. She appears well-developed and well-nourished. No distress.  HENT:  Head: Normocephalic and atraumatic.  Eyes: Conjunctivae and EOM are normal.  Neck: Neck supple.  Cardiovascular: Normal rate, regular rhythm, S1 normal, S2 normal and normal heart sounds.   No murmur heard. Pulmonary/Chest: Effort normal and breath sounds normal. She has no wheezes. She has no rales.  Abdominal: Soft. Bowel sounds are normal. There is tenderness ( mild) in the suprapubic area. There is no rebound, no guarding and no CVA tenderness.  Genitourinary: Cervix exhibits no motion tenderness. Right adnexum displays no tenderness. Left adnexum displays no  tenderness. Vaginal discharge ( brown, thin, frothy) found.  Normal labia. Normal vagina. Small amount of brownish thin frothy discharge. No CMT. TTP of bladder. No adnexal tenderness.  Musculoskeletal: Normal range of motion.  Neurological: She is alert and oriented to person, place, and time.  Skin: Skin is warm and dry.  Psychiatric: She has a normal mood and affect. Her behavior is normal.  Nursing note and  vitals reviewed.  Filed Vitals:   07/02/15 0823  BP: 110/66  Pulse: 71  Temp: 97.6 F (36.4 C)  TempSrc: Oral  Resp: 16  Height: 5' 5.5" (1.664 m)  Weight: 131 lb 9.6 oz (59.693 kg)  SpO2: 97%    Results for orders placed or performed in visit on 07/02/15  POCT urinalysis dipstick  Result Value Ref Range   Color, UA yellow yellow   Clarity, UA clear clear   Glucose, UA negative negative   Bilirubin, UA negative negative   Ketones, POC UA negative negative   Spec Grav, UA 1.025    Blood, UA moderate (A) negative   pH, UA 6.0    Protein Ur, POC =100 (A) negative   Urobilinogen, UA 1.0    Nitrite, UA Negative Negative   Leukocytes, UA small (1+) (A) Negative  POCT Microscopic Urinalysis ([facility])  Result Value Ref Range   WBC,UR,HPF,POC Few (A) None WBC/hpf   RBC,UR,HPF,POC Too numerous to count  (A) None RBC/hpf   Bacteria Few (A) None, Too numerous to count   Mucus Present (A) Absent   Epithelial Cells, UR Per Microscopy Moderate (A) None, Too numerous to count cells/hpf  POCT Wet + KOH Prep  Result Value Ref Range   Yeast by KOH Absent Present, Absent   Yeast by wet prep Absent Present, Absent   WBC by wet prep None None, Few, Too numerous to count   Clue Cells Wet Prep HPF POC Few (A) None, Too numerous to count   Trich by wet prep Absent Present, Absent   Bacteria Wet Prep HPF POC Moderate (A) None, Few, Too numerous to count   Epithelial Cells By [employer] Pref ([facility]) Few None, Few, Too numerous to count   RBC,UR,HPF,POC None None RBC/hpf    Assessment & Plan:   1. Dysuria   2. Irregular uterine bleeding   3. Implantable subdermal contraceptive surveillance   4. Enlarged thyroid gland - rec [place] if perists at f/u.   5. Hematuria - odd UA but pt's sxs are consistent with UTI so cover with bactrim while UClx is P.  Recheck to ensure hematuria resolved in sev wks, RTC asap if sxs worsen.    Orders Placed This Encounter  Procedures  . Urine culture  . GC/Chlamydia  Probe Amp    Order Specific Question:  Source    Answer:  genital  . Thyroid Panel With TSH  . POCT urinalysis dipstick  . POCT Microscopic Urinalysis ([facility])  . POCT Wet + KOH Prep  . POCT CBC    Meds ordered this encounter  Medications  . sulfamethoxazole-trimethoprim (BACTRIM DS,SEPTRA DS) 800-160 MG tablet    Sig: Take 1 tablet by mouth 2 (two) times daily.    Dispense:  10 tablet    Refill:  0  . phenazopyridine (PYRIDIUM) 200 MG tablet    Sig: Take 1 tablet (200 mg total) by mouth 3 (three) times daily as needed (urinary pain).    Dispense:  15 tablet    Refill:  0    I personally performed the  services described in this documentation, which was scribed in my presence. The recorded information has been reviewed and considered, and addended by me as needed.  Delman Cheadle, MD MPH

## 2015-07-02 NOTE — Patient Instructions (Addendum)
IF you received an x-ray today, you will receive an invoice from [facility] Radiology. Please contact [facility]iology at [phone] with questions or concerns regarding your invoice.   IF you received labwork today, you will receive an invoice from [employer]. Please contact [facility] at [phone] with questions or concerns regarding your invoice.   Our billing staff will not be able to assist you with questions regarding bills from these companies.  You will be contacted with the lab results as soon as they are available. The fastest way to get your results is to activate your My Chart account. Instructions are located on the last page of this paperwork. If you have not heard from [place] regarding the results in 2 weeks, please contact this office.    Hematuria, Adult Hematuria is blood in your urine. It can be caused by a bladder infection, kidney infection, prostate infection, kidney stone, or cancer of your urinary tract. Infections can usually be treated with medicine, and a kidney stone usually will pass through your urine. If neither of these is the cause of your hematuria, further workup to find out the reason may be needed. It is very important that you tell your health care provider about any blood you see in your urine, even if the blood stops without treatment or happens without causing pain. Blood in your urine that happens and then stops and then happens again can be a symptom of a very serious condition. Also, pain is not a symptom in the initial stages of many urinary cancers. HOME CARE INSTRUCTIONS   Drink lots of fluid, 3-4 quarts a day. If you have been diagnosed with an infection, cranberry juice is especially recommended, in addition to large amounts of water.  Avoid caffeine, tea, and carbonated beverages because they tend to irritate the bladder.  Avoid alcohol because it may irritate the prostate.  Take all medicines as directed by  your health care provider.  If you were prescribed an antibiotic medicine, finish it all even if you start to feel better.  If you have been diagnosed with a kidney stone, follow your health care provider's instructions regarding straining your urine to catch the stone.  Empty your bladder often. Avoid holding urine for long periods of time.  After a bowel movement, women should cleanse front to back. Use each tissue only once.  Empty your bladder before and after sexual intercourse if you are a female. SEEK MEDICAL CARE IF:  You develop back pain.  You have a fever.  You have a feeling of sickness in your stomach (nausea) or vomiting.  Your symptoms are not better in 3 days. Return sooner if you are getting worse. SEEK IMMEDIATE MEDICAL CARE IF:   You develop severe vomiting and are unable to keep the medicine down.  You develop severe back or abdominal pain despite taking your medicines.  You begin passing a large amount of blood or clots in your urine.  You feel extremely weak or faint, or you pass out. MAKE SURE YOU:   Understand these instructions.  Will watch your condition.  Will get help right away if you are not doing well or get worse.   This information is not intended to replace advice given to you by your health care provider. Make sure you discuss any questions you have with your health care provider.   Document Released: 03/12/2005 Document Revised: 04/02/2014 Document Reviewed: 11/10/2012 Elsevier Interactive Patient Education 2016 [employer]. Goiter A  goiter is an enlarged thyroid gland. The thyroid gland is located in the lower front of the neck. The gland produces hormones that regulate mood, body temperature, pulse rate, and digestion. Most goiters are painless and are not a cause for serious concern. Goiters and conditions that cause goiters can be treated, if necessary. CAUSES Causes of this condition include: Diseases that attack healthy cells in  your body (autoimmune diseases) and affect your thyroid function, such as: Graves disease. This causes too much thyroid hormone to be produced and it makes your thyroid overly active (hyperthyroidism). Hashimoto disease. This type of inflammation of the thyroid (thyroiditis) causes too little thyroid hormone to be produced and it makes your thyroid not active enough (hypothyroidism). Other conditions that cause thyroiditis. Nodular goiter. This means that there are one or more small growths on your thyroid. These can create too much thyroid hormone. Pregnancy. Thyroid cancer. This is rare. Certain medicines. Radiation exposure. Iodine deficiency. In some cases, the cause may not be known (idiopathic). RISK FACTORS This condition is more likely to develop in: People who have a family history of goiter. Women. People who do not get enough iodine in their diet. People who are older than 40. People who smoke tobacco. SYMPTOMS Common symptoms of this condition include: Swelling in the lower part of the neck. This swelling can range from a very small bump to a large lump. A tight feeling in the throat. A hoarse voice. Other symptoms include:  Coughing. Wheezing. Difficulty swallowing. Difficulty breathing. Bulging neck veins. Dizziness. In some cases, there are no symptoms and thyroid hormone levels may be normal. When a goiter is the result of hyperthyroidism, symptoms may also include: Nervousness or restlessness. Inability to tolerate heat. Unexplained weight loss. Diarrhea. Change in the texture of hair or skin. Changes in heart beat, such as skipped beats, extra beats, or a rapid heart rate. Loss of menstruation. Shaky hands. Increased appetite. Sleep problems. When a goiter is the result of hypothyroidism, symptoms may also include: Feeling like you have no energy (lethargy). Inability to tolerate cold. Weight gain that is not explained by a change in diet or exercise  habits. Dry skin. Coarse hair. Menstrual irregularity. Constipation. Sadness or depression. DIAGNOSIS This condition may be diagnosed with a medical history and physical exam. You may also have other tests, including: Blood tests to check thyroid function. Imaging tests, such as: Ultrasonography. CT scan. MRI. Thyroid scan. You will be given a safe radioactive injection, then images will be taken of your thyroid. Tissue sample (biopsy) of the goiter or any nodules. This checks to see if the goiter or nodules are cancerous. TREATMENT Treatment for this condition depends on the cause. Treatment may include: Medicines to control your thyroid. Anti-inflammatory or steroid medicines, if inflammation is the cause. Iodine supplements or changes in diet, if the goiter is caused by iodine deficiency. Radiation therapy. Surgery to remove your thyroid. In some cases, no treatment is necessary, and your health care provider will monitor your condition at regular checkups. HOME CARE INSTRUCTIONS Follow recommendations from your health care provider for any changes to your diet. Take over-the-counter and prescription medicines only as told by your health care provider. Do not use any tobacco products, including cigarettes, chewing tobacco, or e-cigarettes. If you need help quitting, ask your health care provider. Keep all follow-up appointments as told by your health care provider. This is important. SEEK MEDICAL CARE IF: Your symptoms do not get better with treatment. SEEK IMMEDIATE MEDICAL CARE IF:  You develop sudden, unexplained confusion or other mental changes. You have nausea, vomiting, or diarrhea. You develop a fever. Your skin or the whites of your eyes appear yellow (jaundice). You develop chest pain. You have trouble breathing or swallowing. You suddenly become very weak. You experience extreme restlessness.   This information is not intended to replace advice given to you by  your health care provider. Make sure you discuss any questions you have with your health care provider.   Document Released: 08/30/2009 Document Revised: 07/27/2014 Document Reviewed: 03/08/2014 Elsevier Interactive Patient Education [employer]

## 2015-07-03 LAB — URINE CULTURE: Colony Count: 30000

## 2015-07-04 LAB — GC/CHLAMYDIA PROBE AMP
CT Probe RNA: NOT DETECTED
GC PROBE AMP APTIMA: NOT DETECTED

## 2015-07-05 ENCOUNTER — Encounter: Payer: Self-pay | Admitting: Family Medicine

## 2017-03-20 ENCOUNTER — Emergency Department (HOSPITAL_COMMUNITY)
Admission: EM | Admit: 2017-03-20 | Discharge: 2017-03-20 | Disposition: A | Discharge status: Home / Self Care | Payer: Self-pay | Attending: Emergency Medicine | Admitting: Emergency Medicine

## 2017-03-20 ENCOUNTER — Other Ambulatory Visit: Payer: Self-pay

## 2017-03-20 ENCOUNTER — Emergency Department (HOSPITAL_COMMUNITY): Payer: Self-pay

## 2017-03-20 ENCOUNTER — Encounter (HOSPITAL_COMMUNITY): Payer: Self-pay | Admitting: *Deleted

## 2017-03-20 DIAGNOSIS — J40 Bronchitis, not specified as acute or chronic: Secondary | ICD-10-CM | POA: Insufficient documentation

## 2017-03-20 MED ORDER — ALBUTEROL SULFATE HFA 108 (90 BASE) MCG/ACT IN AERS
2.0000 | INHALATION_SPRAY | RESPIRATORY_TRACT | Status: DC | PRN
  Administered 2017-03-20: 2 via RESPIRATORY_TRACT
  Filled 2017-03-20: qty 6.7

## 2017-03-20 MED ORDER — PREDNISONE 20 MG PO TABS: ORAL_TABLET | ORAL | 0 refills | Status: DC

## 2017-03-20 MED ORDER — BENZONATATE 100 MG PO CAPS: 100.0000 mg | ORAL_CAPSULE | Freq: Three times a day (TID) | ORAL | 0 refills | Status: DC

## 2017-03-20 NOTE — Discharge Instructions (Signed)
You have been diagnosed with bronchitis.  Use albuterol inhaler 2 puffs every 4 hours as needed for wheezing or shortness of breath.  Take cough medication and steroid as prescribed.

## 2017-03-20 NOTE — ED Provider Notes (Signed)
[facility] EMERGENCY [facility]PARTMENT Provider Note   CSN: [id number] Arrival date & time: 03/20/17  1025     History   Chief Complaint No chief complaint on file.   HPI Kimberly Callahan is a 21 y.o. female.  HPI   21 year old female who is a non-smoker with history of allergies presenting for evaluation of cold symptoms.  Patient report for more than a week she has had a persistent cough initially productive with white sputum but now she described as a wheezy cough.  She endorsed shortness of breath, and pleuritic chest pain.  Endorsed generalized fatigue and having chills.  No report of fever, severe headache, ear pain, sneezing, nausea vomiting or diarrhea abdominal pain or back pain or rash.  She is a non-smoker.  She has been using over-the-counter cough medication with minimal improvement.  Report his symptom has been about the same or may be a bit worse.  No history of asthma or COPD.  Past Medical History:  Diagnosis Date  . Allergy     Patient Active Problem List   Diagnosis Date Noted  . Swelling of joint, knee, left 03/07/2015    Past Surgical History:  Procedure Laterality Date  . TONSILECTOMY, ADENOIDECTOMY, BILATERAL MYRINGOTOMY AND TUBES  2006-7    OB History    Gravida Para Term Preterm AB Living   0             SAB TAB Ectopic Multiple Live Births                   Home Medications    Prior to Admission medications   Medication Sig Start Date End Date Taking? Authorizing Provider  phenazopyridine (PYRIDIUM) 200 MG tablet Take 1 tablet (200 mg total) by mouth 3 (three) times daily as needed (urinary pain). 07/02/15   Sherren MochaShaw, Eva N, MD  sulfamethoxazole-trimethoprim (BACTRIM DS,SEPTRA DS) 800-160 MG tablet Take 1 tablet by mouth 2 (two) times daily. 07/02/15   Sherren MochaShaw, Eva N, MD    Family History Family History  Problem Relation Age of Onset  . Hypertension Mother     Social History Social History   Tobacco Use  . Smoking status: Never  Smoker  . Smokeless tobacco: Never Used  Substance Use Topics  . Alcohol use: No    Alcohol/week: 0.0 oz  . Drug use: No     Allergies   Augmentin [amoxicillin-pot clavulanate] and Penicillins   Review of Systems Review of Systems  All other systems reviewed and are negative.    Physical Exam Updated Vital Signs BP (!) 111/94 (BP Location: Right Arm)   Pulse 86   Temp 98.2 F (36.8 C) (Oral)   Resp 20   LMP 03/10/2017 (Exact Date)   SpO2 100%   Physical Exam  Constitutional: She is oriented to person, place, and time. She appears well-developed and well-nourished. No distress.  HENT:  Head: Atraumatic.  Right Ear: External ear normal.  Left Ear: External ear normal.  Nose: Nose normal.  Mouth/Throat: Oropharynx is clear and moist.  Eyes: Conjunctivae are normal.  Neck: Normal range of motion. Neck supple.  No nuchal rigidity  Cardiovascular: Normal rate, regular rhythm and intact distal pulses.  Pulmonary/Chest: Effort normal and breath sounds normal. No stridor. No respiratory distress. She has no wheezes. She has no rales. She exhibits no tenderness.  Abdominal: Soft. She exhibits no distension. There is no tenderness.  Musculoskeletal: She exhibits no edema.  Lymphadenopathy:    She has  no cervical adenopathy.  Neurological: She is alert and oriented to person, place, and time.  Skin: No rash noted.  Psychiatric: She has a normal mood and affect.  Nursing note and vitals reviewed.    ED Treatments / Results  Labs (all labs ordered are listed, but only abnormal results are displayed) Labs Reviewed - No data to display  EKG  EKG Interpretation None       Radiology Dg Chest 2 View  Result Date: 03/20/2017 CLINICAL DATA:  A week of cough and exertional shortness of breath. Pleuritic chest pain. No fever. Nonsmoker. EXAM: CHEST  2 VIEW COMPARISON:  None in PACs FINDINGS: The lungs are adequately inflated. The interstitial markings are mildly  prominent. There is no alveolar infiltrate or pleural effusion. The heart and pulmonary vascularity are normal. The bony thorax is unremarkable. IMPRESSION: Mild interstitial prominence may reflect acute bronchitis. There is no alveolar pneumonia. Electronically Signed   By: David  [place] M.D.   On: 03/20/2017 11:24    Procedures Procedures (including critical care time)  Medications Ordered in ED Medications  albuterol (PROVENTIL HFA;VENTOLIN HFA) 108 (90 Base) MCG/ACT inhaler 2 puff (not administered)     Initial Impression / Assessment and Plan / ED Course  I have reviewed the triage vital signs and the nursing notes.  Pertinent labs & imaging results that were available during my care of the patient were reviewed by me and considered in my medical decision making (see chart for details).     BP (!) 111/94 (BP Location: Right Arm)   Pulse 86   Temp 98.2 F (36.8 C) (Oral)   Resp 20   LMP 03/10/2017 (Exact Date)   SpO2 100%    Final Clinical Impressions(s) / ED Diagnoses   Final diagnoses:  Bronchitis    ED Discharge Orders        Ordered    benzonatate (TESSALON) 100 MG capsule  Every 8 hours     03/20/17 1247    predniSONE (DELTASONE) 20 MG tablet     03/20/17 1247     12:45 PM Patient with cold symptoms for more than a week.  Chest x-ray today showing findings suggestive of bronchitis but no evidence of pneumonia.  She does endorse occasional wheezing and shortness of breath.  Therefore, patient will be treated for bronchitis with a course of steroid, cough medication, and albuterol inhaler as needed.  She is stable for discharge.   Fayrene Helperran, Klyde Banka, PA-C 03/20/17 1248    Doug SouJacubowitz, Sam, MD 03/20/17 [phone]

## 2017-03-20 NOTE — ED Triage Notes (Signed)
Cough for over week, pt states it was productive and now changed to a dry wheezing cough.  No asthma. No fever

## 2018-03-26 NOTE — L&D Delivery Note (Addendum)
Delivery Note   Patient Name: Kimberly Callahan DOB: November 20, 1995 MRN: [id number]  Date of admission: 12/01/2018 Delivering MD: Janyth Pupa  Date of delivery: 12/01/18 Type of delivery: Vacuum- assisted vaginal delivery  Newborn Data: Live born female  Birth Weight: 6 lb 14.1 oz (3121 g) APGAR: 8, 9  Newborn Delivery   Birth date/time: 12/01/2018 17:37:00 Delivery type:       Norm Salt, 23 y.o., @ [redacted]w[redacted]d,  G1P0, who was admitted for early labor, spontaneous. I was called to the room when she progressed +1 station in the second stage of labor. Pt felt urges to push, during pushing prolonged varible noted to nadir of 90s, slow return to baseline was noted. OP positioning was noted via fetal sutures. Dr Nelda Marseille was called for vacuum assessment and assist. NICU was called to attend. Pt continue to push with continued variable noted, pt was turned from left to right which helped, DR Jory Tanguma reviewed R/B/A about vacuum and pt verbalized consent. Dr Nelda Marseille had one pop off then fetus turned naturally, maternal pushing effort delivered rest of baby.  She pushed for 33mins.   She delivered a viable infant, cephalic and restituted to the LOA position over an intact perineum.  A tight nuchal cord   was identified and reduced.  The cord was cut and clamped and the baby was brought to the radiant warmer in stable condition see NICU note. Delayed cord clamping was not performed.   Apgar scores were 8 and 9. Prophylactic Pitocin was started in the third stage of labor for active management. The placenta delivered spontaneously with a 3 vessel cord.  Patient desired to keep placenta and was not sent to pathology. 2nd degree perineal tear was noted and repaired in the usual fashion using 2-0 and 3-0 vicryl.  There were no complications during the procedure. Mom and baby were in stable condition and skin to skin was initiated.  pH cord blood (arterial) 7.210 - 7.380 7.267   pCO2 cord blood (arterial) 42.0 - 56.0 mmHg 54.5    Bicarbonate 13.0 - 22.0 mmol/L 24.0High    Resulting Agency  [facility]   Maternal Info: Anesthesia: Epidural Episiotomy: No Lacerations:  2nd degree Suture Repair: 2-0 and 3-0 vicryl Est. Blood Loss (mL):  266mL  Newborn Info:  Baby Sex: female Circumcision: in pt desired Babies Name: Dakari APGAR (1 MIN): 8   APGAR (5 MINS): 9   APGAR (10 MINS):    Mom to postpartum.  Baby to Couplet care / Skin to Skin.  [place], [place], NP-C 12/01/18 5:50 PM   ADDENDUM: I was in attendance for delivery and repair as outlined above.  Janyth Pupa, DO [phone] (cell) [phone] (office)

## 2018-04-18 ENCOUNTER — Ambulatory Visit (HOSPITAL_COMMUNITY)
Admission: RE | Admit: 2018-04-18 | Discharge: 2018-04-18 | Disposition: A | Discharge status: Home / Self Care | Payer: Self-pay | Source: Ambulatory Visit | Attending: Obstetrics and Gynecology | Admitting: Obstetrics and Gynecology

## 2018-04-18 ENCOUNTER — Other Ambulatory Visit (HOSPITAL_COMMUNITY): Payer: Self-pay | Admitting: Obstetrics and Gynecology

## 2018-04-18 DIAGNOSIS — R102 Pelvic and perineal pain: Secondary | ICD-10-CM

## 2018-04-30 ENCOUNTER — Other Ambulatory Visit: Payer: Self-pay

## 2018-04-30 ENCOUNTER — Encounter (HOSPITAL_COMMUNITY): Payer: Self-pay

## 2018-04-30 ENCOUNTER — Emergency Department (HOSPITAL_COMMUNITY)
Admission: EM | Admit: 2018-04-30 | Discharge: 2018-04-30 | Disposition: A | Discharge status: Home / Self Care | Payer: Medicaid Other | Attending: Emergency Medicine | Admitting: Emergency Medicine

## 2018-04-30 DIAGNOSIS — Z3A08 8 weeks gestation of pregnancy: Secondary | ICD-10-CM | POA: Insufficient documentation

## 2018-04-30 DIAGNOSIS — O210 Mild hyperemesis gravidarum: Secondary | ICD-10-CM | POA: Insufficient documentation

## 2018-04-30 DIAGNOSIS — Z79899 Other long term (current) drug therapy: Secondary | ICD-10-CM | POA: Insufficient documentation

## 2018-04-30 LAB — CBC
HCT: 39.2 % (ref 36.0–46.0)
HEMOGLOBIN: 13.7 g/dL (ref 12.0–15.0)
MCH: 32 pg (ref 26.0–34.0)
MCHC: 34.9 g/dL (ref 30.0–36.0)
MCV: 91.6 fL (ref 80.0–100.0)
Platelets: 250 10*3/uL (ref 150–400)
RBC: 4.28 MIL/uL (ref 3.87–5.11)
RDW: 12.4 % (ref 11.5–15.5)
WBC: 11.4 10*3/uL — ABNORMAL HIGH (ref 4.0–10.5)
nRBC: 0 % (ref 0.0–0.2)

## 2018-04-30 LAB — URINALYSIS, ROUTINE W REFLEX MICROSCOPIC
Bilirubin Urine: NEGATIVE
GLUCOSE, UA: NEGATIVE mg/dL
Hgb urine dipstick: NEGATIVE
Ketones, ur: 80 mg/dL — AB
LEUKOCYTES UA: NEGATIVE
NITRITE: NEGATIVE
PH: 5 (ref 5.0–8.0)
Protein, ur: NEGATIVE mg/dL
SPECIFIC GRAVITY, URINE: 1.028 (ref 1.005–1.030)

## 2018-04-30 LAB — COMPREHENSIVE METABOLIC PANEL
ALT: 28 U/L (ref 0–44)
AST: 19 U/L (ref 15–41)
Albumin: 3.9 g/dL (ref 3.5–5.0)
Alkaline Phosphatase: 40 U/L (ref 38–126)
Anion gap: 12 (ref 5–15)
BUN: 10 mg/dL (ref 6–20)
CO2: 21 mmol/L — AB (ref 22–32)
Calcium: 9.5 mg/dL (ref 8.9–10.3)
Chloride: 104 mmol/L (ref 98–111)
Creatinine, Ser: 0.69 mg/dL (ref 0.44–1.00)
GFR calc Af Amer: >60 mL/min (ref >60)
GFR calc non Af Amer: >60 mL/min (ref >60)
Glucose, Bld: 87 mg/dL (ref 70–99)
POTASSIUM: 3.3 mmol/L — AB (ref 3.5–5.1)
SODIUM: 137 mmol/L (ref 135–145)
Total Bilirubin: 1.1 mg/dL (ref 0.3–1.2)
Total Protein: 7 g/dL (ref 6.5–8.1)

## 2018-04-30 LAB — I-STAT BETA HCG BLOOD, ED (MC, WL, AP ONLY): I-stat hCG, quantitative: >2000 m[IU]/mL — ABNORMAL HIGH (ref <5)

## 2018-04-30 LAB — LIPASE, BLOOD: LIPASE: 22 U/L (ref 11–51)

## 2018-04-30 MED ORDER — VITAMIN B-6 50 MG PO TABS: 50.0000 mg | ORAL_TABLET | Freq: Three times a day (TID) | ORAL | 0 refills | Status: DC

## 2018-04-30 MED ORDER — ONDANSETRON HCL 4 MG/2ML IJ SOLN
4.0000 mg | Freq: Once | INTRAMUSCULAR | Status: AC
  Administered 2018-04-30: 4 mg via INTRAVENOUS
  Filled 2018-04-30: qty 2

## 2018-04-30 MED ORDER — ONDANSETRON HCL 4 MG PO TABS: 4.0000 mg | ORAL_TABLET | Freq: Three times a day (TID) | ORAL | 0 refills | Status: DC | PRN

## 2018-04-30 MED ORDER — SODIUM CHLORIDE 0.9 % IV BOLUS
1000.0000 mL | Freq: Once | INTRAVENOUS | Status: AC
  Administered 2018-04-30: 1000 mL via INTRAVENOUS

## 2018-04-30 MED ORDER — SODIUM CHLORIDE 0.9% FLUSH: 3.0000 mL | Freq: Once | INTRAVENOUS | Status: DC

## 2018-04-30 NOTE — Discharge Instructions (Signed)
You were seen in the emergency department for nausea vomiting and diarrhea in the setting of being [redacted] weeks pregnant.  You were very dehydrated and your symptoms improved after getting some IV fluids and some nausea medicine.  We sent off 2 prescriptions for medications that may help with your nausea.  Please call your OB as soon as possible and review this with them.

## 2018-04-30 NOTE — ED Triage Notes (Signed)
Pt reports she is [redacted] weeks pregnant. Reports persistent emesis and being unable to tolerate any oral intake. Skin warm and dry, vitals stable.

## 2018-04-30 NOTE — ED Notes (Signed)
Patient provided ginger ale for po fluid challenge 

## 2018-04-30 NOTE — ED Provider Notes (Signed)
[facility] Provider Note   CSN: [id number] Arrival date & time: 04/30/18  0217     History   Chief Complaint Chief Complaint  Patient presents with  . Emesis    HPI Kimberly Callahan is a 23 y.o. female.  She is [redacted] weeks pregnant.  She is complaining of 2 days of nausea vomiting and diarrhea.  No real abdominal pain no fevers no chills.  She said she is throwing up about 10 times a day.  No vaginal bleeding or discharge.  She said she saw her OB about 2 weeks ago.  She is not taking anything for morning sickness.  She tried nothing for her vomiting.  The history is provided by the patient.  Emesis  Severity:  Severe Duration:  2 days Timing:  Intermittent Number of daily episodes:  10 Quality:  Stomach contents Progression:  Unchanged Chronicity:  New Recent urination:  Normal Context: not post-tussive and not self-induced   Relieved by:  None tried Worsened by:  Food smell and liquids Ineffective treatments:  None tried Associated symptoms: diarrhea and headaches   Associated symptoms: no abdominal pain, no chills, no cough, no fever, no sore throat and no URI   Risk factors: pregnant   Risk factors: no travel to endemic areas     Past Medical History:  Diagnosis Date  . Allergy     Patient Active Problem List   Diagnosis Date Noted  . Swelling of joint, knee, left 03/07/2015    Past Surgical History:  Procedure Laterality Date  . TONSILECTOMY, ADENOIDECTOMY, BILATERAL MYRINGOTOMY AND TUBES  2006-7     OB History    Gravida  1   Para      Term      Preterm      AB      Living        SAB      TAB      Ectopic      Multiple      Live Births               Home Medications    Prior to Admission medications   Medication Sig Start Date End Date Taking? Authorizing Provider  benzonatate (TESSALON) 100 MG capsule Take 1 capsule (100 mg total) by mouth every 8 (eight) hours. 03/20/17   Fayrene Helperran, [place], PA-C    phenazopyridine (PYRIDIUM) 200 MG tablet Take 1 tablet (200 mg total) by mouth 3 (three) times daily as needed (urinary pain). 07/02/15   Sherren MochaShaw, Eva N, MD  predniSONE (DELTASONE) 20 MG tablet 2 tabs po daily x 4 days 03/20/17   Fayrene Helperran, [place], PA-C  sulfamethoxazole-trimethoprim (BACTRIM DS,SEPTRA DS) 800-160 MG tablet Take 1 tablet by mouth 2 (two) times daily. 07/02/15   Sherren MochaShaw, Eva N, MD    Family History Family History  Problem Relation Age of Onset  . Hypertension Mother     Social History Social History   Tobacco Use  . Smoking status: Never Smoker  . Smokeless tobacco: Never Used  Substance Use Topics  . Alcohol use: No    Alcohol/week: 0.0 standard drinks  . Drug use: No     Allergies   Augmentin [amoxicillin-pot clavulanate] and Penicillins   Review of Systems Review of Systems  Constitutional: Negative for chills and fever.  HENT: Negative for sore throat.   Eyes: Negative for visual disturbance.  Respiratory: Negative for cough and shortness of breath.   Cardiovascular: Negative for chest  pain.  Gastrointestinal: Positive for diarrhea and vomiting. Negative for abdominal pain.  Genitourinary: Negative for dysuria.  Musculoskeletal: Negative for back pain.  Skin: Negative for rash.  Neurological: Positive for headaches.     Physical Exam Updated Vital Signs BP 117/84 (BP Location: Right Arm)   Pulse 95   Temp 98.5 F (36.9 C) (Oral)   Resp 17   SpO2 100%   Physical Exam Vitals signs and nursing note reviewed.  Constitutional:      General: She is not in acute distress.    Appearance: She is well-developed.  HENT:     Head: Normocephalic and atraumatic.  Eyes:     Conjunctiva/sclera: Conjunctivae normal.  Neck:     Musculoskeletal: Neck supple.  Cardiovascular:     Rate and Rhythm: Normal rate and regular rhythm.     Heart sounds: No murmur.  Pulmonary:     Effort: Pulmonary effort is normal. No respiratory distress.     Breath sounds: Normal  breath sounds.  Abdominal:     Palpations: Abdomen is soft.     Tenderness: There is no abdominal tenderness.  Musculoskeletal: Normal range of motion.        General: No tenderness or signs of injury.  Skin:    General: Skin is warm and dry.     Capillary Refill: Capillary refill takes less than 2 seconds.  Neurological:     General: No focal deficit present.     Mental Status: She is alert.      ED Treatments / Results  Labs (all labs ordered are listed, but only abnormal results are displayed) Labs Reviewed  COMPREHENSIVE METABOLIC PANEL - Abnormal; Notable for the following components:      Result Value   Potassium 3.3 (*)    CO2 21 (*)    All other components within normal limits  CBC - Abnormal; Notable for the following components:   WBC 11.4 (*)    All other components within normal limits  URINALYSIS, ROUTINE W REFLEX MICROSCOPIC - Abnormal; Notable for the following components:   Color, Urine AMBER (*)    APPearance HAZY (*)    Ketones, ur 80 (*)    All other components within normal limits  I-STAT BETA HCG BLOOD, ED ([facility], [facility], [facility] ONLY) - Abnormal; Notable for the following components:   I-stat hCG, quantitative >2,000.0 (*)    All other components within normal limits  LIPASE, BLOOD    EKG None  Radiology No results found.  Procedures Ultrasound ED OB Pelvic Date/Time: 04/30/2018 6:22 PM Performed by: Terrilee Files, MD Authorized by: Terrilee Files, MD   Procedure details:    Indications: evaluate for IUP     Technique:  Transabdominal obstetric (HCG+) exam   Images: archived    Uterine findings:    Intrauterine pregnancy: identified     Single gestation: identified     Gestational sac: identified     Yolk sac: not identified     Fetal pole: not identified     Fetal heart rate: not identified     Estimated gestational age: 31 weeks   (including critical care time)  Medications Ordered in ED Medications  sodium chloride flush (NS) 0.9 %  injection 3 mL (has no administration in time range)  sodium chloride 0.9 % bolus 1,000 mL (has no administration in time range)  ondansetron (ZOFRAN) injection 4 mg (has no administration in time range)     Initial Impression / Assessment and Plan /  ED Course  I have reviewed the triage vital signs and the nursing notes.  Pertinent labs & imaging results that were available during my care of the patient were reviewed by me and considered in my medical decision making (see chart for details).  Clinical Course as of Apr 30 1820  Wed Apr 30, 2018  [id number] Patient received a liter of fluid and some Zofran.  She is feeling improved and has been able to take some fluids.  She feels comfortable with discharge.   [MB]  1038 I was able to transabdominally ultrasound her and she does have evidence of an IUP although it was still too early for me to document a heart rate.  Patient referred back to her OB for further management.   [MB]    Clinical Course User Index [MB] Terrilee FilesButler, Michael C, MD    Final Clinical Impressions(s) / ED Diagnoses   Final diagnoses:  Hyperemesis gravidarum    ED Discharge Orders         Ordered    pyridOXINE (VITAMIN B-6) 50 MG tablet  3 times daily     04/30/18 1003    ondansetron (ZOFRAN) 4 MG tablet  Every 8 hours PRN     04/30/18 1003           Terrilee FilesButler, Michael C, MD 04/30/18 [phone]

## 2018-05-20 ENCOUNTER — Inpatient Hospital Stay (HOSPITAL_COMMUNITY)
Admission: EM | Admit: 2018-05-20 | Discharge: 2018-05-21 | Disposition: A | Discharge status: Home / Self Care | Payer: 59 | Attending: Obstetrics and Gynecology | Admitting: Obstetrics and Gynecology

## 2018-05-20 ENCOUNTER — Other Ambulatory Visit: Payer: Self-pay

## 2018-05-20 ENCOUNTER — Encounter (HOSPITAL_COMMUNITY): Payer: Self-pay | Admitting: Emergency Medicine

## 2018-05-20 DIAGNOSIS — Z880 Allergy status to penicillin: Secondary | ICD-10-CM | POA: Diagnosis not present

## 2018-05-20 DIAGNOSIS — O211 Hyperemesis gravidarum with metabolic disturbance: Secondary | ICD-10-CM | POA: Diagnosis not present

## 2018-05-20 DIAGNOSIS — O219 Vomiting of pregnancy, unspecified: Secondary | ICD-10-CM

## 2018-05-20 DIAGNOSIS — Z3A10 10 weeks gestation of pregnancy: Secondary | ICD-10-CM | POA: Diagnosis not present

## 2018-05-20 DIAGNOSIS — E860 Dehydration: Secondary | ICD-10-CM

## 2018-05-20 DIAGNOSIS — O210 Mild hyperemesis gravidarum: Secondary | ICD-10-CM | POA: Diagnosis present

## 2018-05-20 LAB — URINALYSIS, ROUTINE W REFLEX MICROSCOPIC
BILIRUBIN URINE: NEGATIVE
GLUCOSE, UA: NEGATIVE mg/dL
Hgb urine dipstick: NEGATIVE
KETONES UR: 80 mg/dL — AB
Nitrite: NEGATIVE
PH: 5 (ref 5.0–8.0)
Protein, ur: 30 mg/dL — AB
Specific Gravity, Urine: 1.029 (ref 1.005–1.030)

## 2018-05-20 LAB — CBC
HCT: 41 % (ref 36.0–46.0)
Hemoglobin: 14 g/dL (ref 12.0–15.0)
MCH: 30.9 pg (ref 26.0–34.0)
MCHC: 34.1 g/dL (ref 30.0–36.0)
MCV: 90.5 fL (ref 80.0–100.0)
NRBC: 0 % (ref 0.0–0.2)
PLATELETS: 227 10*3/uL (ref 150–400)
RBC: 4.53 MIL/uL (ref 3.87–5.11)
RDW: 12.1 % (ref 11.5–15.5)
WBC: 8.7 10*3/uL (ref 4.0–10.5)

## 2018-05-20 LAB — BASIC METABOLIC PANEL
Anion gap: 13 (ref 5–15)
BUN: 11 mg/dL (ref 6–20)
CO2: 21 mmol/L — ABNORMAL LOW (ref 22–32)
Calcium: 9.6 mg/dL (ref 8.9–10.3)
Chloride: 103 mmol/L (ref 98–111)
Creatinine, Ser: 0.67 mg/dL (ref 0.44–1.00)
GFR calc Af Amer: >60 mL/min (ref >60)
GLUCOSE: 79 mg/dL (ref 70–99)
Potassium: 3.4 mmol/L — ABNORMAL LOW (ref 3.5–5.1)
Sodium: 137 mmol/L (ref 135–145)

## 2018-05-20 MED ORDER — FAMOTIDINE IN NACL 20-0.9 MG/50ML-% IV SOLN
20.0000 mg | Freq: Once | INTRAVENOUS | Status: AC
  Administered 2018-05-20: 20 mg via INTRAVENOUS
  Filled 2018-05-20: qty 50

## 2018-05-20 MED ORDER — DEXAMETHASONE SODIUM PHOSPHATE 10 MG/ML IJ SOLN
10.0000 mg | Freq: Once | INTRAMUSCULAR | Status: AC
  Administered 2018-05-20: 10 mg via INTRAVENOUS
  Filled 2018-05-20: qty 1

## 2018-05-20 MED ORDER — SODIUM CHLORIDE 0.9 % IV SOLN
25.0000 mg | Freq: Once | INTRAVENOUS | Status: AC
  Administered 2018-05-20: 25 mg via INTRAVENOUS
  Filled 2018-05-20: qty 1

## 2018-05-20 NOTE — MAU Note (Signed)
Pt presents to MAU c/o NV pt is unable to keep anything down. No pain or bleeding. Pt states she was around 150lbs when she got pregnant and is now around 130s.

## 2018-05-20 NOTE — ED Notes (Signed)
Call placed to MAU. Per MD transport the patient to MAU.

## 2018-05-20 NOTE — MAU Provider Note (Signed)
Chief Complaint: Hyperemesis Gravidarum   First Provider Initiated Contact with Patient 05/20/18 2141       CC[facility]   SUBJECTIVE HPI: Kimberly Callahan is a 23 y.o. G1P0 at [redacted]w[redacted]d by LMP who presents to maternity admissions reporting vomiting since January.  This week became unable to keep even water down.  Was seen in ED on 04/30/2018 and given Zofran for home use.  Worked for a while but no longer helping. She denies vaginal bleeding, vaginal itching/burning, urinary symptoms, h/a, dizziness, or fever/chills.    Emesis   This is a recurrent problem. The current episode started 1 to 4 weeks ago. The problem has been unchanged. There has been no fever. Pertinent negatives include no abdominal pain, chills, diarrhea, dizziness, fever or headaches. Treatments tried: zofran. The treatment provided no relief.   RN Note: Pt presents to MAU c/o NV pt is unable to keep anything down. No pain or bleeding. Pt states she was around 150lbs when she got pregnant and is now around 130s.  Past Medical History:  Diagnosis Date  . Allergy    Past Surgical History:  Procedure Laterality Date  . TONSILECTOMY, ADENOIDECTOMY, BILATERAL MYRINGOTOMY AND TUBES  2006-7   Social History   Socioeconomic History  . Marital status: Single    Spouse name: n/a  . Number of children: 0  . Years of education: college  . Highest education level: Not on file  Occupational History  . Occupation: Industrial/product designer    Comment: Personnel officer  Social Needs  . Financial resource strain: Not on file  . Food insecurity:    Worry: Not on file    Inability: Not on file  . Transportation needs:    Medical: Not on file    Non-medical: Not on file  Tobacco Use  . Smoking status: Never Smoker  . Smokeless tobacco: Never Used  Substance and Sexual Activity  . Alcohol use: No    Alcohol/week: 0.0 standard drinks  . Drug use: No  . Sexual activity: Yes    Partners: Male    Birth control/protection: Condom  Lifestyle  .  Physical activity:    Days per week: Not on file    Minutes per session: Not on file  . Stress: Not on file  Relationships  . Social connections:    Talks on phone: Not on file    Gets together: Not on file    Attends religious service: Not on file    Active member of club or organization: Not on file    Attends meetings of clubs or organizations: Not on file    Relationship status: Not on file  . Intimate partner violence:    Fear of current or ex partner: Not on file    Emotionally abused: Not on file    Physically abused: Not on file    Forced sexual activity: Not on file  Other Topics Concern  . Not on file  Social History Narrative   Lives alone.   Her family lives nearby.   No current facility-administered medications on file prior to encounter.    Current Outpatient Medications on File Prior to Encounter  Medication Sig Dispense Refill  . benzonatate (TESSALON) 100 MG capsule Take 1 capsule (100 mg total) by mouth every 8 (eight) hours. 21 capsule 0  . ondansetron (ZOFRAN) 4 MG tablet Take 1 tablet (4 mg total) by mouth every 8 (eight) hours as needed for nausea or vomiting. 20 tablet 0  . phenazopyridine (  PYRIDIUM) 200 MG tablet Take 1 tablet (200 mg total) by mouth 3 (three) times daily as needed (urinary pain). 15 tablet 0  . predniSONE (DELTASONE) 20 MG tablet 2 tabs po daily x 4 days 8 tablet 0  . pyridOXINE (VITAMIN B-6) 50 MG tablet Take 1 tablet (50 mg total) by mouth 3 (three) times daily. 60 tablet 0  . sulfamethoxazole-trimethoprim (BACTRIM DS,SEPTRA DS) 800-160 MG tablet Take 1 tablet by mouth 2 (two) times daily. 10 tablet 0   Allergies  Allergen Reactions  . Augmentin [Amoxicillin-Pot Clavulanate] Hives  . Penicillins     I have reviewed patient's Past Medical Hx, Surgical Hx, Family Hx, Social Hx, medications and allergies.   ROS:  Review of Systems  Constitutional: Negative for chills and fever.  Gastrointestinal: Positive for vomiting. Negative for  abdominal pain and diarrhea.  Neurological: Negative for dizziness and headaches.   Review of Systems  Other systems negative   Physical Exam  Physical Exam Patient Vitals for the past 24 hrs:  BP Temp Temp src Pulse Resp SpO2 Height Weight  05/20/18 1942 112/78 98.7 F (37.1 C) Oral 93 17 - - 63.2 kg  05/20/18 1922 118/82 98.8 F (37.1 C) Oral (!) 108 19 98 % 5\' 4"  (1.626 m) 68 kg  05/20/18 1918 - - - - - - 5\' 4"  (1.626 m) -   Constitutional: Well-developed, well-nourished female in no acute distress.  Cardiovascular: normal rate Respiratory: normal effort GI: Abd soft, non-tender. Pos BS x 4 MS: Extremities nontender, no edema, normal ROM Neurologic: Alert and oriented x 4.  GU: Neg CVAT.  PELVIC EXAM: deferred  FHT 175 by doppler  LAB RESULTS Results for orders placed or performed during the hospital encounter of 05/20/18 (from the past 24 hour(s))  Urinalysis, Routine w reflex microscopic     Status: Abnormal   Collection Time: 05/20/18  7:41 PM  Result Value Ref Range   Color, Urine AMBER (A) YELLOW   APPearance HAZY (A) CLEAR   Specific Gravity, Urine 1.029 1.005 - 1.030   pH 5.0 5.0 - 8.0   Glucose, UA NEGATIVE NEGATIVE mg/dL   Hgb urine dipstick NEGATIVE NEGATIVE   Bilirubin Urine NEGATIVE NEGATIVE   Ketones, ur 80 (A) NEGATIVE mg/dL   Protein, ur 30 (A) NEGATIVE mg/dL   Nitrite NEGATIVE NEGATIVE   Leukocytes,Ua TRACE (A) NEGATIVE   RBC / HPF 0-5 0 - 5 RBC/hpf   WBC, UA 11-20 0 - 5 WBC/hpf   Bacteria, UA FEW (A) NONE SEEN   Squamous Epithelial / LPF 11-20 0 - 5   Mucus PRESENT        IMAGING No results found.  MAU Management/MDM: Ordered CBC and BMET Ordered Promethazine infusion WIll also add Decadron and Pepcid Felt better after fluids and medication Able to keep soda and crackers down.  ASSESSMENT Single intrauterine pregnancy at [redacted]w[redacted]d Hyperemesis with weight loss reported Dehydration  PLAN Discharge home Has Rx for Zofran from ED Rx  Phenergan provided for prn use at home for nausea Advance diet as tolerated Followup at [facility] as scheduled  Encouraged to return here or to other Urgent Care/ED if she develops worsening of symptoms, increase in pain, fever, or other concerning symptoms.    Wynelle Bourgeois CNM, MSN Certified Nurse-Midwife 05/20/2018  9:43 PM

## 2018-05-20 NOTE — ED Triage Notes (Signed)
Pt presents with persistent nausea and vomiting she is [redacted] weeks pregnant, recently seen here and diagnoses with hyperemesis gravidarum. Denise emesis with blood or abdominal pain. A/O x4 at triage.

## 2018-05-21 MED ORDER — PROMETHAZINE HCL 25 MG PO TABS: 25.0000 mg | ORAL_TABLET | Freq: Four times a day (QID) | ORAL | 2 refills | Status: DC | PRN

## 2018-05-21 NOTE — Discharge Instructions (Signed)
Morning Sickness    Morning sickness is when you feel sick to your stomach (nauseous) during pregnancy. You may feel sick to your stomach and throw up (vomit). You may feel sick in the morning, but you can feel this way at any time of day. Some women feel very sick to their stomach and cannot stop throwing up (hyperemesis gravidarum).  Follow these instructions at home:  Medicines   Take over-the-counter and prescription medicines only as told by your doctor. Do not take any medicines until you talk with your doctor about them first.   Taking multivitamins before getting pregnant can stop or lessen the harshness of morning sickness.  Eating and drinking   Eat dry toast or crackers before getting out of bed.   Eat 5 or 6 small meals a day.   Eat dry and bland foods like rice and baked potatoes.   Do not eat greasy, fatty, or spicy foods.   Have someone cook for you if the smell of food causes you to feel sick or throw up.   If you feel sick to your stomach after taking prenatal vitamins, take them at night or with a snack.   Eat protein when you need a snack. Nuts, yogurt, and cheese are good choices.   Drink fluids throughout the day.   Try ginger ale made with real ginger, ginger tea made from fresh grated ginger, or ginger candies.  General instructions   Do not use any products that have nicotine or tobacco in them, such as cigarettes and e-cigarettes. If you need help quitting, ask your doctor.   Use an air purifier to keep the air in your house free of smells.   Get lots of fresh air.   Try to avoid smells that make you feel sick.   Try:  ? Wearing a bracelet that is used for seasickness (acupressure wristband).  ? Going to a doctor who puts thin needles into certain body points (acupuncture) to improve how you feel.  Contact a doctor if:   You need medicine to feel better.   You feel dizzy or light-headed.   You are losing weight.  Get help right away if:   You feel very sick to your  stomach and cannot stop throwing up.   You pass out (faint).   You have very bad pain in your belly.  Summary   Morning sickness is when you feel sick to your stomach (nauseous) during pregnancy.   You may feel sick in the morning, but you can feel this way at any time of day.   Making some changes to what you eat may help your symptoms go away.  This information is not intended to replace advice given to you by your health care provider. Make sure you discuss any questions you have with your health care provider.  Document Released: 04/19/2004 Document Revised: 04/12/2016 Document Reviewed: 04/12/2016  Elsevier Interactive Patient Education  2019 [employer]

## 2018-10-20 ENCOUNTER — Inpatient Hospital Stay (HOSPITAL_COMMUNITY)
Admission: AD | Admit: 2018-10-20 | Discharge: 2018-10-20 | Disposition: A | Discharge status: Home / Self Care | Payer: 59 | Attending: Obstetrics and Gynecology | Admitting: Obstetrics and Gynecology

## 2018-10-20 ENCOUNTER — Encounter (HOSPITAL_COMMUNITY): Payer: Self-pay | Admitting: *Deleted

## 2018-10-20 ENCOUNTER — Other Ambulatory Visit: Payer: Self-pay

## 2018-10-20 DIAGNOSIS — O210 Mild hyperemesis gravidarum: Secondary | ICD-10-CM | POA: Diagnosis not present

## 2018-10-20 DIAGNOSIS — Z1159 Encounter for screening for other viral diseases: Secondary | ICD-10-CM | POA: Insufficient documentation

## 2018-10-20 DIAGNOSIS — O26893 Other specified pregnancy related conditions, third trimester: Secondary | ICD-10-CM | POA: Insufficient documentation

## 2018-10-20 DIAGNOSIS — Z3A33 33 weeks gestation of pregnancy: Secondary | ICD-10-CM | POA: Diagnosis not present

## 2018-10-20 DIAGNOSIS — Z880 Allergy status to penicillin: Secondary | ICD-10-CM | POA: Insufficient documentation

## 2018-10-20 DIAGNOSIS — R0602 Shortness of breath: Secondary | ICD-10-CM | POA: Insufficient documentation

## 2018-10-20 LAB — URINALYSIS, ROUTINE W REFLEX MICROSCOPIC
Bilirubin Urine: NEGATIVE
Glucose, UA: NEGATIVE mg/dL
Hgb urine dipstick: NEGATIVE
Ketones, ur: 5 mg/dL — AB
Nitrite: NEGATIVE
Protein, ur: 30 mg/dL — AB
Specific Gravity, Urine: 1.03 (ref 1.005–1.030)
pH: 5 (ref 5.0–8.0)

## 2018-10-20 LAB — SARS CORONAVIRUS 2 BY RT PCR (HOSPITAL ORDER, PERFORMED IN ~~LOC~~ HOSPITAL LAB): SARS Coronavirus 2: NEGATIVE

## 2018-10-20 MED ORDER — FAMOTIDINE IN NACL 20-0.9 MG/50ML-% IV SOLN
20.0000 mg | Freq: Once | INTRAVENOUS | Status: AC
  Administered 2018-10-20: 20 mg via INTRAVENOUS
  Filled 2018-10-20: qty 50

## 2018-10-20 MED ORDER — M.V.I. ADULT IV INJ
INJECTION | Freq: Once | INTRAVENOUS | Status: AC
  Administered 2018-10-20: 11:00:00 via INTRAVENOUS
  Filled 2018-10-20: qty 1000

## 2018-10-20 MED ORDER — PROMETHAZINE HCL 25 MG/ML IJ SOLN
25.0000 mg | Freq: Once | INTRAMUSCULAR | Status: AC
  Administered 2018-10-20: 25 mg via INTRAVENOUS
  Filled 2018-10-20: qty 1

## 2018-10-20 NOTE — Discharge Instructions (Signed)
The shortness of breath you are experiencing is probably due to pregnancy and your increased nausea and vomiting.

## 2018-10-20 NOTE — MAU Note (Signed)
Pt reports she had an appointment noticed she ws breathing shallow. Pt stated she has been having some SOB since yesterday. Has had N/V the whole pregnancy . Urine showed she was dehydrated in office and was advised to come to MAU for some fluids.

## 2018-10-20 NOTE — MAU Provider Note (Signed)
History     CSN: [id number]  Arrival date and time: 10/20/18 [id number]   First Provider Initiated Contact with Patient 10/20/18 1001      Chief Complaint  Patient presents with  . Emesis  . Shortness of Breath   HPI  Ms.  Kimberly Callahan is a 23 y.o. year old G1P0 female at 5879w2d weeks gestation who was sent to MAU by [facility] for IVFs and COVID-19 testing. She reports that she has "been up since 2200 last night throwing up." She also reports that SOB started last night. She denies any sick contact or possible exposure to COVID-19, because she "stays in the house." She denies any UC's, VB, or LOF. She reports good (+) FM.  Past Medical History:  Diagnosis Date  . Allergy     Past Surgical History:  Procedure Laterality Date  . TONSILECTOMY, ADENOIDECTOMY, BILATERAL MYRINGOTOMY AND TUBES  2006-7    Family History  Problem Relation Age of Onset  . Hypertension Mother     Social History   Tobacco Use  . Smoking status: Never Smoker  . Smokeless tobacco: Never Used  Substance Use Topics  . Alcohol use: No    Alcohol/week: 0.0 standard drinks  . Drug use: No    Allergies:  Allergies  Allergen Reactions  . Augmentin [Amoxicillin-Pot Clavulanate] Hives  . Penicillins     Medications Prior to Admission  Medication Sig Dispense Refill Last Dose  . ondansetron (ZOFRAN) 4 MG tablet Take 1 tablet (4 mg total) by mouth every 8 (eight) hours as needed for nausea or vomiting. 20 tablet 0 10/19/2018 at Unknown time  . promethazine (PHENERGAN) 25 MG tablet Take 1 tablet (25 mg total) by mouth every 6 (six) hours as needed for nausea or vomiting. May use vaginally prn 30 tablet 2 10/20/2018 at Unknown time  . benzonatate (TESSALON) 100 MG capsule Take 1 capsule (100 mg total) by mouth every 8 (eight) hours. 21 capsule 0   . phenazopyridine (PYRIDIUM) 200 MG tablet Take 1 tablet (200 mg total) by mouth 3 (three) times daily as needed (urinary pain). 15 tablet 0   . predniSONE (DELTASONE) 20  MG tablet 2 tabs po daily x 4 days 8 tablet 0   . pyridOXINE (VITAMIN B-6) 50 MG tablet Take 1 tablet (50 mg total) by mouth 3 (three) times daily. 60 tablet 0   . sulfamethoxazole-trimethoprim (BACTRIM DS,SEPTRA DS) 800-160 MG tablet Take 1 tablet by mouth 2 (two) times daily. 10 tablet 0     Review of Systems  Constitutional: Negative.   HENT: Negative.   Eyes: Negative.   Respiratory: Positive for shortness of breath (started last night).   Cardiovascular: Negative.   Gastrointestinal: Positive for nausea and vomiting ("since 2200 last night").  Endocrine: Negative.   Genitourinary: Negative.   Musculoskeletal: Negative.   Skin: Negative.   Allergic/Immunologic: Negative.   Neurological: Negative.   Hematological: Negative.   Psychiatric/Behavioral: Negative.    Physical Exam   Blood pressure 110/74, pulse (!) 112, temperature 98.7 F (37.1 C), resp. rate 18, SpO2 96 %.  Physical Exam  Nursing note and vitals reviewed. Constitutional: She is oriented to person, place, and time. She appears well-developed and well-nourished.  HENT:  Head: Normocephalic and atraumatic.  Right Ear: External ear normal.  Left Ear: External ear normal.  Nose: Nose normal.  Mouth/Throat: Oropharynx is clear and moist.  Eyes: Pupils are equal, round, and reactive to light.  Neck: Normal range of motion.  Cardiovascular:  Normal rate, regular rhythm, normal heart sounds and intact distal pulses.  Respiratory: Effort normal and breath sounds normal.  GI: Soft. Bowel sounds are decreased.  Genitourinary:    Genitourinary Comments: Pelvic deferred   Musculoskeletal: Normal range of motion.  Neurological: She is alert and oriented to person, place, and time. She has normal reflexes.  Skin: Skin is warm and dry.  Psychiatric: She has a normal mood and affect. Her behavior is normal. Judgment and thought content normal.   NST - FHR: 150 bpm / moderate variability / accels present / decels absent /  TOCO: none  MAU Course  Procedures  MDM [facility] IVFs: Phenergan 25 mg in LR 1000 ml @ 999 ml/hr; followed by MVI in LR 1000 ml @ 999 ml/hr -- no nausea/vomiting "I feel so much better." Pepcid 20 mg IVPB PO Challenge -- patient tolerated well  COVID-19 Rapid test  Results for orders placed or performed during the hospital encounter of 10/20/18 (from the past 24 hour(s))  Urinalysis, Routine w reflex microscopic     Status: Abnormal   Collection Time: 10/20/18  9:52 AM  Result Value Ref Range   Color, Urine AMBER (A) YELLOW   APPearance CLOUDY (A) CLEAR   Specific Gravity, Urine 1.030 1.005 - 1.030   pH 5.0 5.0 - 8.0   Glucose, UA NEGATIVE NEGATIVE mg/dL   Hgb urine dipstick NEGATIVE NEGATIVE   Bilirubin Urine NEGATIVE NEGATIVE   Ketones, ur 5 (A) NEGATIVE mg/dL   Protein, ur 30 (A) NEGATIVE mg/dL   Nitrite NEGATIVE NEGATIVE   Leukocytes,Ua MODERATE (A) NEGATIVE   RBC / HPF 21-50 0 - 5 RBC/hpf   WBC, UA 11-20 0 - 5 WBC/hpf   Bacteria, UA MANY (A) NONE SEEN   Squamous Epithelial / LPF 0-5 0 - 5   Mucus PRESENT   SARS Coronavirus 2 (CEPHEID- Performed in [place] hospital lab), Hosp Order     Status: None   Collection Time: 10/20/18 10:36 AM   Specimen: Nasopharyngeal Swab  Result Value Ref Range   SARS Coronavirus 2 NEGATIVE NEGATIVE   Assessment and Plan  Hyperemesis gravidarum - Rx sent by [facility] provider  Shortness of breath due to pregnancy in third trimester  - Advised of negative COVID-19 results - Discussed safety measures for avoiding COVID-19 in community - Normal variance of pregnancy and exhaustion from being up all night vomiting - Information provided on SOB in adult  - Discharge home - Keep scheduled appts with [facility] - Patient verbalized an understanding of the plan of care and agrees.      Laury Deep, MSN, CNM 10/20/2018, 10:04 AM

## 2018-12-01 ENCOUNTER — Inpatient Hospital Stay (HOSPITAL_COMMUNITY): Payer: 59 | Admitting: Anesthesiology

## 2018-12-01 ENCOUNTER — Other Ambulatory Visit: Payer: Self-pay

## 2018-12-01 ENCOUNTER — Encounter (HOSPITAL_COMMUNITY): Payer: Self-pay | Admitting: *Deleted

## 2018-12-01 ENCOUNTER — Inpatient Hospital Stay (HOSPITAL_COMMUNITY)
Admission: AD | Admit: 2018-12-01 | Discharge: 2018-12-03 | DRG: 807 | Disposition: A | Discharge status: Home / Self Care | Payer: 59 | Attending: Obstetrics and Gynecology | Admitting: Obstetrics and Gynecology

## 2018-12-01 DIAGNOSIS — Z3A39 39 weeks gestation of pregnancy: Secondary | ICD-10-CM | POA: Diagnosis not present

## 2018-12-01 DIAGNOSIS — Z91040 Latex allergy status: Secondary | ICD-10-CM

## 2018-12-01 DIAGNOSIS — O26893 Other specified pregnancy related conditions, third trimester: Secondary | ICD-10-CM

## 2018-12-01 DIAGNOSIS — Z20828 Contact with and (suspected) exposure to other viral communicable diseases: Secondary | ICD-10-CM | POA: Diagnosis present

## 2018-12-01 DIAGNOSIS — Z8759 Personal history of other complications of pregnancy, childbirth and the puerperium: Secondary | ICD-10-CM

## 2018-12-01 DIAGNOSIS — Z880 Allergy status to penicillin: Secondary | ICD-10-CM

## 2018-12-01 DIAGNOSIS — O210 Mild hyperemesis gravidarum: Secondary | ICD-10-CM

## 2018-12-01 LAB — CBC
HCT: 38 % (ref 36.0–46.0)
Hemoglobin: 12.8 g/dL (ref 12.0–15.0)
MCH: 30.8 pg (ref 26.0–34.0)
MCHC: 33.7 g/dL (ref 30.0–36.0)
MCV: 91.6 fL (ref 80.0–100.0)
Platelets: 158 10*3/uL (ref 150–400)
RBC: 4.15 MIL/uL (ref 3.87–5.11)
RDW: 13.2 % (ref 11.5–15.5)
WBC: 15.4 10*3/uL — ABNORMAL HIGH (ref 4.0–10.5)
nRBC: 0 % (ref 0.0–0.2)

## 2018-12-01 LAB — ABO/RH: ABO/RH(D): A POS

## 2018-12-01 LAB — RPR: RPR Ser Ql: NONREACTIVE

## 2018-12-01 LAB — SARS CORONAVIRUS 2 BY RT PCR (HOSPITAL ORDER, PERFORMED IN ~~LOC~~ HOSPITAL LAB): SARS Coronavirus 2: NEGATIVE

## 2018-12-01 LAB — TYPE AND SCREEN
ABO/RH(D): A POS
Antibody Screen: NEGATIVE

## 2018-12-01 MED ORDER — COCONUT OIL OIL: 1.0000 "application " | TOPICAL_OIL | Status: DC | PRN

## 2018-12-01 MED ORDER — EPHEDRINE 5 MG/ML INJ: 10.0000 mg | INTRAVENOUS | Status: DC | PRN

## 2018-12-01 MED ORDER — ONDANSETRON HCL 4 MG PO TABS: 4.0000 mg | ORAL_TABLET | ORAL | Status: DC | PRN

## 2018-12-01 MED ORDER — FENTANYL-BUPIVACAINE-NACL 0.5-0.125-0.9 MG/250ML-% EP SOLN
EPIDURAL | Status: AC
  Filled 2018-12-01: qty 250

## 2018-12-01 MED ORDER — PHENYLEPHRINE 40 MCG/ML (10ML) SYRINGE FOR IV PUSH (FOR BLOOD PRESSURE SUPPORT): 80.0000 ug | PREFILLED_SYRINGE | INTRAVENOUS | Status: DC | PRN

## 2018-12-01 MED ORDER — LIDOCAINE HCL (PF) 1 % IJ SOLN: 30.0000 mL | INTRAMUSCULAR | Status: DC | PRN

## 2018-12-01 MED ORDER — OXYTOCIN BOLUS FROM INFUSION
500.0000 mL | Freq: Once | INTRAVENOUS | Status: AC
  Administered 2018-12-01: 500 mL via INTRAVENOUS

## 2018-12-01 MED ORDER — TETANUS-DIPHTH-ACELL PERTUSSIS 5-2.5-18.5 LF-MCG/0.5 IM SUSP: 0.5000 mL | Freq: Once | INTRAMUSCULAR | Status: DC

## 2018-12-01 MED ORDER — DIPHENHYDRAMINE HCL 25 MG PO CAPS: 25.0000 mg | ORAL_CAPSULE | Freq: Four times a day (QID) | ORAL | Status: DC | PRN

## 2018-12-01 MED ORDER — SENNOSIDES-DOCUSATE SODIUM 8.6-50 MG PO TABS
2.0000 | ORAL_TABLET | ORAL | Status: DC
  Administered 2018-12-01 – 2018-12-02 (×2): 2 via ORAL
  Filled 2018-12-01 (×2): qty 2

## 2018-12-01 MED ORDER — OXYCODONE-ACETAMINOPHEN 5-325 MG PO TABS: 1.0000 | ORAL_TABLET | ORAL | Status: DC | PRN

## 2018-12-01 MED ORDER — LACTATED RINGERS IV SOLN: 500.0000 mL | Freq: Once | INTRAVENOUS | Status: DC

## 2018-12-01 MED ORDER — LACTATED RINGERS IV SOLN
INTRAVENOUS | Status: DC
  Administered 2018-12-01: 12:00:00 via INTRAVENOUS

## 2018-12-01 MED ORDER — PRENATAL MULTIVITAMIN CH
1.0000 | ORAL_TABLET | Freq: Every day | ORAL | Status: DC
  Administered 2018-12-02: 1 via ORAL
  Filled 2018-12-01: qty 1

## 2018-12-01 MED ORDER — IBUPROFEN 600 MG PO TABS
600.0000 mg | ORAL_TABLET | Freq: Four times a day (QID) | ORAL | Status: DC
  Administered 2018-12-01 – 2018-12-03 (×6): 600 mg via ORAL
  Filled 2018-12-01 (×6): qty 1

## 2018-12-01 MED ORDER — WITCH HAZEL-GLYCERIN EX PADS: 1.0000 "application " | MEDICATED_PAD | CUTANEOUS | Status: DC | PRN

## 2018-12-01 MED ORDER — SIMETHICONE 80 MG PO CHEW: 80.0000 mg | CHEWABLE_TABLET | ORAL | Status: DC | PRN

## 2018-12-01 MED ORDER — ZOLPIDEM TARTRATE 5 MG PO TABS: 5.0000 mg | ORAL_TABLET | Freq: Every evening | ORAL | Status: DC | PRN

## 2018-12-01 MED ORDER — DIBUCAINE (PERIANAL) 1 % EX OINT: 1.0000 "application " | TOPICAL_OINTMENT | CUTANEOUS | Status: DC | PRN

## 2018-12-01 MED ORDER — OXYTOCIN 40 UNITS IN NORMAL SALINE INFUSION - SIMPLE MED
2.5000 [IU]/h | INTRAVENOUS | Status: DC
  Filled 2018-12-01: qty 1000

## 2018-12-01 MED ORDER — ONDANSETRON HCL 4 MG/2ML IJ SOLN
4.0000 mg | Freq: Four times a day (QID) | INTRAMUSCULAR | Status: DC | PRN
  Administered 2018-12-01: 14:00:00 4 mg via INTRAVENOUS
  Filled 2018-12-01: qty 2

## 2018-12-01 MED ORDER — FENTANYL CITRATE (PF) 100 MCG/2ML IJ SOLN
INTRAMUSCULAR | Status: AC
  Filled 2018-12-01: qty 2

## 2018-12-01 MED ORDER — ACETAMINOPHEN 325 MG PO TABS: 650.0000 mg | ORAL_TABLET | ORAL | Status: DC | PRN

## 2018-12-01 MED ORDER — OXYCODONE-ACETAMINOPHEN 5-325 MG PO TABS: 2.0000 | ORAL_TABLET | ORAL | Status: DC | PRN

## 2018-12-01 MED ORDER — FENTANYL CITRATE (PF) 100 MCG/2ML IJ SOLN
100.0000 ug | Freq: Once | INTRAMUSCULAR | Status: AC
  Administered 2018-12-01: 12:00:00 100 ug via INTRAVENOUS

## 2018-12-01 MED ORDER — PHENYLEPHRINE 40 MCG/ML (10ML) SYRINGE FOR IV PUSH (FOR BLOOD PRESSURE SUPPORT)
80.0000 ug | PREFILLED_SYRINGE | INTRAVENOUS | Status: DC | PRN
  Filled 2018-12-01: qty 10

## 2018-12-01 MED ORDER — ONDANSETRON HCL 4 MG/2ML IJ SOLN: 4.0000 mg | INTRAMUSCULAR | Status: DC | PRN

## 2018-12-01 MED ORDER — SOD CITRATE-CITRIC ACID 500-334 MG/5ML PO SOLN: 30.0000 mL | ORAL | Status: DC | PRN

## 2018-12-01 MED ORDER — LIDOCAINE HCL (PF) 1 % IJ SOLN
INTRAMUSCULAR | Status: DC | PRN
  Administered 2018-12-01: 10 mL via EPIDURAL

## 2018-12-01 MED ORDER — FENTANYL-BUPIVACAINE-NACL 0.5-0.125-0.9 MG/250ML-% EP SOLN: 12.0000 mL/h | EPIDURAL | Status: DC | PRN

## 2018-12-01 MED ORDER — SODIUM CHLORIDE (PF) 0.9 % IJ SOLN
INTRAMUSCULAR | Status: DC | PRN
  Administered 2018-12-01: 12 mL/h via EPIDURAL

## 2018-12-01 MED ORDER — DIPHENHYDRAMINE HCL 50 MG/ML IJ SOLN: 12.5000 mg | INTRAMUSCULAR | Status: DC | PRN

## 2018-12-01 MED ORDER — LACTATED RINGERS IV SOLN: 500.0000 mL | INTRAVENOUS | Status: DC | PRN

## 2018-12-01 MED ORDER — BENZOCAINE-MENTHOL 20-0.5 % EX AERO
1.0000 "application " | INHALATION_SPRAY | CUTANEOUS | Status: DC | PRN
  Administered 2018-12-01: 1 via TOPICAL
  Filled 2018-12-01: qty 56

## 2018-12-01 NOTE — H&P (Addendum)
Kimberly Callahan is a 23 y.o. female, G1P0000, IUP at 39.2 weeks, presenting for early spontaneous labor. Cxt started one day ago but picked up about 8am. Pt endorse + Fm. Denies vaginal leakage. Denies vaginal bleeding. PN issues consist of HG, allergy to latex and penicillin. EFW 7.2 on 8/28. GBS-. Low risk Female baby in pt circ. COvid pending. Denies s/sx or URI.   Patient Active Problem List   Diagnosis Date Noted  . Hyperemesis gravidarum 10/20/2018  . Shortness of breath due to pregnancy in third trimester 10/20/2018  . Swelling of joint, knee, left 03/07/2015    Medications Prior to Admission  Medication Sig Dispense Refill Last Dose  . ondansetron (ZOFRAN) 4 MG tablet Take 1 tablet (4 mg total) by mouth every 8 (eight) hours as needed for nausea or vomiting. 20 tablet 0   . promethazine (PHENERGAN) 25 MG tablet Take 1 tablet (25 mg total) by mouth every 6 (six) hours as needed for nausea or vomiting. May use vaginally prn 30 tablet 2   . pyridOXINE (VITAMIN B-6) 50 MG tablet Take 1 tablet (50 mg total) by mouth 3 (three) times daily. 60 tablet 0   . sulfamethoxazole-trimethoprim (BACTRIM DS,SEPTRA DS) 800-160 MG tablet Take 1 tablet by mouth 2 (two) times daily. 10 tablet 0     Past Medical History:  Diagnosis Date  . Allergy      No current facility-administered medications on file prior to encounter.    Current Outpatient Medications on File Prior to Encounter  Medication Sig Dispense Refill  . ondansetron (ZOFRAN) 4 MG tablet Take 1 tablet (4 mg total) by mouth every 8 (eight) hours as needed for nausea or vomiting. 20 tablet 0  . promethazine (PHENERGAN) 25 MG tablet Take 1 tablet (25 mg total) by mouth every 6 (six) hours as needed for nausea or vomiting. May use vaginally prn 30 tablet 2  . pyridOXINE (VITAMIN B-6) 50 MG tablet Take 1 tablet (50 mg total) by mouth 3 (three) times daily. 60 tablet 0  . sulfamethoxazole-trimethoprim (BACTRIM DS,SEPTRA DS) 800-160 MG tablet Take 1  tablet by mouth 2 (two) times daily. 10 tablet 0     Allergies  Allergen Reactions  . Augmentin [Amoxicillin-Pot Clavulanate] Hives  . Penicillins   . Latex Rash    History of present pregnancy: Pt Info/Preference:  Screening/Consents:  Labs:   EDD: Estimated Date of Delivery: 12/06/18  Establised: No LMP recorded. Patient is pregnant.  Anatomy Scan: Date: 07/15/2018 Placenta Location: posterior Genetic Screen: Panoroma:low risk female AFP:  First Tri: Quad:  Office: [facility]            First PNV: 14.3 wg Blood Type  A+  Language: english Last PNV: 39.3 wg Rhogam  positive  Flu Vaccine:  declined   Antibody  neg  TDaP vaccine declined   GTT: Early: 4.9 hga1c Third Trimester: wnl  Feeding Plan: breast BTL: no Rubella:  immune  Contraception: ??? VBAC: no RPR:   nr  Circumcision: In pt desired   HBsAg:  neg  Pediatrician:  ???   HIV:   neg  Prenatal Classes: no Additional US: Growth 8/28 see below GBS:  negative on 8/17(For PCN allergy, check sensitivities)       Chlamydia: neg    MFM Referral/Consult:  GC: neg  Support Person: BF   PAP: 2019 normal   Pain Management: epidural Neonatologist Referral:  Hgb Electrophoresis:  AA  Birth Plan: none   Hgb NOB: 13.4  28W: 12.8  8/25 growth  OB History    Gravida  1   Para      Term      Preterm      AB      Living        SAB      TAB      Ectopic      Multiple      Live Births             Past Medical History:  Diagnosis Date  . Allergy    Past Surgical History:  Procedure Laterality Date  . TONSILECTOMY, ADENOIDECTOMY, BILATERAL MYRINGOTOMY AND TUBES  2006-7   Family History: family history includes Hypertension in her mother. Social History:  reports that she has never smoked. She has never used smokeless tobacco. She reports that she does not drink alcohol or use drugs.   Prenatal Transfer Tool  Maternal Diabetes: No Genetic Screening: Normal Maternal Ultrasounds/Referrals: Normal Fetal Ultrasounds  or other Referrals:  None Maternal Substance Abuse:  No Significant Maternal Medications:  None Significant Maternal Lab Results: Group B Strep negative  ROS:  Review of Systems  Constitutional: Negative.   HENT: Negative.   Eyes: Negative.   Respiratory: Negative.   Cardiovascular: Negative.   Gastrointestinal: Positive for abdominal pain.  Genitourinary: Negative.   Musculoskeletal: Negative.   Skin: Negative.   Neurological: Negative.   Endo/Heme/Allergies: Negative.   Psychiatric/Behavioral: Negative.      Physical Exam: There were no vitals taken for this visit.  Physical Exam  Constitutional: She is oriented to person, place, and time and well-developed, well-nourished, and in no distress.  HENT:  Head: Normocephalic and atraumatic.  Eyes: Pupils are equal, round, and reactive to light. Conjunctivae are normal.  Neck: Normal range of motion. Neck supple.  Cardiovascular: Normal rate and regular rhythm.  Pulmonary/Chest: Effort normal and breath sounds normal.  Abdominal: Soft. Bowel sounds are normal.  Genitourinary:    Genitourinary Comments: Uterus soft and non-tender, gravida equal to dates. Pelvis adequate    Musculoskeletal: Normal range of motion.  Neurological: She is alert and oriented to person, place, and time. Gait normal.  Skin: Skin is warm and dry.  Psychiatric: Affect normal.  Nursing note and vitals reviewed.    NST: FHR baseline 150 bpm, Variability: moderate, Accelerations:present, Decelerations:  Absent= Cat 1/Reactive UC:   regular, every 2 minutes, lasting 60 seconds SVE:   Dilation: 2 Effacement (%): 100 Exam by:: [facility], NP, vertex verified by fetal sutures.  Leopold's: Position vertex, EFW 7lbs via leopold's.   Labs: No results found for this or any previous visit (from the past 24 hour(s)).  Imaging:  No results found.  MAU Course: Orders Placed This Encounter  Procedures  . Cervical Exam   No orders of the defined types  were placed in this encounter.   Assessment/Plan: Kimberly Callahan is a 23 y.o. female, G1P0000, IUP at 39.2 weeks, presenting for early spontaneous labor. Cxt started one day ago but picked up about 8am. Pt endorse + Fm. Denies vaginal leakage. Denies vaginal bleeding. PN issues consist of HG, allergy to latex and penicillin. EFW 7.2 on 8/28. GBS-. Low risk Female baby in pt circ. COvid pending. Denies s/sx or URI.   FWB: Cat 1 Fetal Tracing.   Plan: Admit to [place] per consult with DR Nelda Marseille Routine [facility] orders Pain med/epidural prn, wants placed now Anticipate labor progression   Noralyn Pick NP-C, CNM, MSN 12/01/2018, 11:33  AM     

## 2018-12-01 NOTE — Anesthesia Procedure Notes (Signed)
Epidural Patient location during procedure: OB Start time: 12/01/2018 12:15 PM End time: 12/01/2018 12:25 PM  Staffing Anesthesiologist: Lidia Collum, MD Performed: anesthesiologist   Preanesthetic Checklist Completed: patient identified, pre-op evaluation, timeout performed, IV checked, risks and benefits discussed and monitors and equipment checked  Epidural Patient position: sitting Prep: DuraPrep Patient monitoring: heart rate, continuous pulse ox and blood pressure Approach: midline Location: L3-L4 Injection technique: LOR air  Needle:  Needle type: Tuohy  Needle gauge: 17 G Needle length: 9 cm Needle insertion depth: 4 cm Catheter type: closed end flexible Catheter size: 19 Gauge Catheter at skin depth: 9 cm Test dose: negative  Assessment Events: blood not aspirated, injection not painful, no injection resistance, negative IV test and no paresthesia  Additional Notes Reason for block:procedure for pain

## 2018-12-01 NOTE — Consult Note (Signed)
Neonatology Note:   Attendance at Delivery:    I was asked by Dr. Nelda Marseille to attend this vacuum assist vaginal delivery at term. The mother is a G1, GBS neg with good prenatal care complicated only by fetal decels prior to delivery. ROM 3 hours before delivery, fluid moderate meconium.  Tight nuchal reduced.  Infant vigorous with good spontaneous cry and tone. +40sec DCC.  Needed only minimal bulb suctioning. Ap 8/9. Lungs clear to ausc in DR. Father introduced to son and mother briefly updated. To CN to care of Pediatrician.  Monia Sabal Katherina Mires, MD

## 2018-12-01 NOTE — Progress Notes (Signed)
Labor Progress Note  Kimberly Callahan is a 23 y.o. female, G1P0000, IUP at 39.2 weeks, presenting for early spontaneous labor. Cxt started one day ago but picked up about 8am. Pt endorse + Fm. Denies vaginal leakage. Denies vaginal bleeding. PN issues consist of HG, allergy to latex and penicillin. EFW 7.2 on 8/28. GBS-. Low risk Female baby in pt circ. COvid pending. Denies s/sx or URI.   Subjective: Pt resting in bed post epidural , RN report x3 late decel but drop oin BP was noted after epidural, phenylephrine was administer with resolution as well as maternal position change, pt and fetus stable at this time.  Patient Active Problem List   Diagnosis Date Noted  . Normal labor and delivery 12/01/2018  . Hyperemesis gravidarum 10/20/2018  . Shortness of breath due to pregnancy in third trimester 10/20/2018  . Swelling of joint, knee, left 03/07/2015   Objective: BP 111/81   Pulse 88   Resp 18   SpO2 97%  No intake/output data recorded. No intake/output data recorded. NST: FHR baseline 140 bpm, Variability: moderate, Accelerations:present, Decelerations:  Absent= Cat 1/Reactive CTX:  regular, every 2-3 minutes, lasting 70-100 seoconds Uterus gravid, soft non tender, moderate to palpate with contractions.  SVE:  Dilation: 4 Effacement (%): 100 Station: 0 Exam by:: [facility]  Assessment:  Kimberly Callahan is a 23 y.o. female, G1P0000, IUP at 39.2 weeks, presenting for early spontaneous labor. Cxt started one day ago but picked up about 8am. Pt endorse + Fm. Denies vaginal leakage. Denies vaginal bleeding. PN issues consist of HG, allergy to latex and penicillin. EFW 7.2 on 8/28. GBS-. Low risk Female baby in pt circ. Covid negative. Progressing in early labor, comfortable post epidural.   Patient Active Problem List   Diagnosis Date Noted  . Normal labor and delivery 12/01/2018  . Hyperemesis gravidarum 10/20/2018  . Shortness of breath due to pregnancy in third trimester 10/20/2018  .  Swelling of joint, knee, left 03/07/2015   NICHD: Category 1  Membranes: Intact, BBOW, no s/s of infection  Pain management:               IV pain management: x1 dose of 100 mcg fentanyl.              Epidural placement:  at 1230 on 9/7  GBS Negative   Plan: Continue labor plan Continuous monitoring Rest Frequent position changes to facilitate fetal rotation and descent. Will reassess with cervical exam at 1800 or earlier if necessary Anticipate labor progression and vaginal delivery.  Baby Female: In pt circ desired.   Noralyn Pick, NP-C, CNM, MSN 12/01/2018. 2:04 PM

## 2018-12-01 NOTE — MAU Note (Signed)
Contractions started at 0800, getting closer and stronger. No bleeding or leaking. Denies problems with preg.  No recent exam

## 2018-12-01 NOTE — Anesthesia Preprocedure Evaluation (Signed)

## 2018-12-02 DIAGNOSIS — Z8759 Personal history of other complications of pregnancy, childbirth and the puerperium: Secondary | ICD-10-CM

## 2018-12-02 LAB — CBC
HCT: 31.7 % — ABNORMAL LOW (ref 36.0–46.0)
Hemoglobin: 10.7 g/dL — ABNORMAL LOW (ref 12.0–15.0)
MCH: 31.2 pg (ref 26.0–34.0)
MCHC: 33.8 g/dL (ref 30.0–36.0)
MCV: 92.4 fL (ref 80.0–100.0)
Platelets: 139 10*3/uL — ABNORMAL LOW (ref 150–400)
RBC: 3.43 MIL/uL — ABNORMAL LOW (ref 3.87–5.11)
RDW: 13.4 % (ref 11.5–15.5)
WBC: 16.1 10*3/uL — ABNORMAL HIGH (ref 4.0–10.5)
nRBC: 0 % (ref 0.0–0.2)

## 2018-12-02 NOTE — Lactation Note (Signed)
This note was copied from a baby's chart. Lactation Consultation Note Baby 7 hrs old. Mom states baby BF great after delivery hasn't wanted to BF since 2145 and BF for 20 min. Mom has offered breast several time. Baby has nasal congestion. Mom has been BF in cradle position. Suggested in football position to allow baby to breath better.  Set mom up w/support pillows.  Mom has everted nipples round small breast. Baby latched well after several attempts, baby wasn't opening mouth wide. Finally got baby latched w/need of chin tug to widen flange. Baby suckling well. Mom denies pain. Opposite breast leaking. Mom states she has been leaking for about 2 days. LC hand expressed 9 ml while mom feeding. Milk storage discussed. Suggested spoon feeding to stimulate baby to feed if no cueing noted or give as desert after feeding.  Mom encouraged to feed baby 8-12 times/24 hours and with feeding cues. Mom encouraged to waken baby for feeds if hasn't cued in 3 hrs. Newborn behavior, STS, I&O, breast massage, supply and demand discussed. Mom has DEBP at home. LC gave mom hand pump if mom feels full and baby hasn't relieved breast or if she is leaking on opposite side breast of feeding. No noise from nasal congestion noted while feeding. Encouraged mom to call for assistance or questions. Lactation brochure given.  Patient Name: Kimberly Callahan HENID'P Date: 12/02/2018 Reason for consult: Initial assessment;Primapara   Maternal Data Has patient been taught Hand Expression?: Yes Does the patient have breastfeeding experience prior to this delivery?: No  Feeding Feeding Type: Breast Fed  LATCH Score Latch: Grasps breast easily, tongue down, lips flanged, rhythmical sucking.  Audible Swallowing: Spontaneous and intermittent  Type of Nipple: Everted at rest and after stimulation  Comfort (Breast/Nipple): Soft / non-tender  Hold (Positioning): Assistance needed to correctly position infant at breast and  maintain latch.  LATCH Score: 9  Interventions Interventions: Breast feeding basics reviewed;Adjust position;Assisted with latch;Support pillows;Skin to skin;Position options;Breast massage;Expressed milk;Hand express;Breast compression;Hand pump  Lactation Tools Discussed/Used Tools: Pump Breast pump type: Manual WIC Program: Yes Pump Review: Setup, frequency, and cleaning;Milk Storage Initiated by:: Allayne Stack RN IBCLC Date initiated:: 12/02/18   Consult Status Consult Status: Follow-up Date: 12/03/18 Follow-up type: In-patient    Shaun Zuccaro, Elta Gu[place] 12/02/2018, 1:27 AM

## 2018-12-02 NOTE — Progress Notes (Signed)
Subjective: Postpartum Day 1: Vaginal delivery, 2nd laceration Patient up ad lib, reports no syncope or dizziness. Feeding: Breastfeeding going well Contraceptive plan: undecided  Objective: Vital signs in last 24 hours: Temp:  [97.9 F (36.6 C)-99 F (37.2 C)] 98.2 F (36.8 C) (09/08 0015) Pulse Rate:  [76-154] 102 (09/08 0015) Resp:  [18-20] 18 (09/08 0015) BP: (95-132)/(51-97) 109/66 (09/08 0015) SpO2:  [95 %-100 %] 97 % (09/08 0015)  Physical Exam:  General: alert, cooperative and no distress Lochia: appropriate Uterine Fundus: firm Perineum: healing well DVT Evaluation: No evidence of DVT seen on physical exam.   CBC Latest Ref Rng & Units 12/01/2018 05/20/2018 04/30/2018  WBC 4.0 - 10.5 K/uL 15.4(H) 8.7 11.4(H)  Hemoglobin 12.0 - 15.0 g/dL 12.8 14.0 13.7  Hematocrit 36.0 - 46.0 % 38.0 41.0 39.2  Platelets 150 - 400 K/uL 158 227 250     Assessment/Plan: Status post vaginal delivery day 1. Stable Continue current care. Breastfeeding, Lactation consult and Circumcision prior to discharge    Kimberly Koch ProtheroCNM 12/02/2018, 4:31 AM

## 2018-12-03 MED ORDER — IBUPROFEN 600 MG PO TABS: 600.0000 mg | ORAL_TABLET | Freq: Four times a day (QID) | ORAL | 0 refills | Status: DC

## 2018-12-03 MED ORDER — ACETAMINOPHEN 325 MG PO TABS: 650.0000 mg | ORAL_TABLET | ORAL | Status: DC | PRN

## 2018-12-03 MED ORDER — COCONUT OIL OIL: 1.0000 "application " | TOPICAL_OIL | 0 refills | Status: DC | PRN

## 2018-12-03 MED ORDER — PRENATAL MULTIVITAMIN CH: 1.0000 | ORAL_TABLET | Freq: Every day | ORAL | Status: DC

## 2018-12-03 MED ORDER — BENZOCAINE-MENTHOL 20-0.5 % EX AERO: 1.0000 "application " | INHALATION_SPRAY | CUTANEOUS | Status: DC | PRN

## 2018-12-03 NOTE — Discharge Summary (Signed)
Obstetric Discharge Summary Reason for Admission: onset of labor Prenatal Procedures: ultrasound Intrapartum Procedures: vacuum assisted vaginal delivery and epidural Postpartum Procedures: none Complications-Operative and Postpartum: 2nd degree perineal laceration Hemoglobin  Date Value Ref Range Status  12/02/2018 10.7 (L) 12.0 - 15.0 g/dL Final   HCT  Date Value Ref Range Status  12/02/2018 31.7 (L) 36.0 - 46.0 % Final    Physical Exam:  General: alert, cooperative and no distress Lochia: appropriate Uterine Fundus: firm Incision: healing well DVT Evaluation: No cords or calf tenderness.  Discharge Diagnoses: Term Pregnancy-delivered  Discharge Information: Date: 12/03/2018 Activity: pelvic rest Diet: routine Medications:  Allergies as of 12/03/2018      Reactions   Augmentin [amoxicillin-pot Clavulanate] Hives   Penicillins    Latex Rash      Medication List    STOP taking these medications   ondansetron 4 MG tablet Commonly known as: ZOFRAN   promethazine 25 MG tablet Commonly known as: PHENERGAN   pyridOXINE 50 MG tablet Commonly known as: VITAMIN B-6   sulfamethoxazole-trimethoprim 800-160 MG tablet Commonly known as: BACTRIM DS     TAKE these medications   acetaminophen 325 MG tablet Commonly known as: Tylenol Take 2 tablets (650 mg total) by mouth every 4 (four) hours as needed (for pain scale < 4).   benzocaine-Menthol 20-0.5 % Aero Commonly known as: DERMOPLAST Apply 1 application topically as needed for irritation (perineal discomfort).   coconut oil Oil Apply 1 application topically as needed.   ibuprofen 600 MG tablet Commonly known as: ADVIL Take 1 tablet (600 mg total) by mouth every 6 (six) hours.   prenatal multivitamin Tabs tablet Take 1 tablet by mouth daily at 12 noon.            Discharge Care Instructions  (From admission, onward)         Start     Ordered   12/03/18 0000  Discharge wound care:    Comments: Sitz  baths 2 times /day with warm water x 1 week. May add herbals: 1 ounce dried comfrey leaf* 1 ounce calendula flowers 1 ounce lavender flowers 1/2 ounce dried uva ursi leaves 1/2 ounce witch hazel blossoms (if you can find them) 1/2 ounce dried sage leaf 1/2 cup sea salt Directions: Bring 2 quarts of water to a boil. Turn off heat, and place 1 ounce (approximately 1 large handful) of the above mixed herbs (not the salt) into the pot. Steep, covered, for 30 minutes.  Strain the liquid well with a fine mesh strainer, and discard the herb material. Add 2 quarts of liquid to the tub, along with the 1/2 cup of salt. This medicinal liquid can also be made into compresses and peri-rinses.   12/03/18 0506         Condition: stable Instructions: refer to practice specific booklet and per hospital booklet Discharge to: home  Contraception: considering Paragard IUD / prefers non-hormonal methods Follow-up Information    Everett Graff, MD. Schedule an appointment as soon as possible for a visit in 6 week(s).   Specialty: Obstetrics and Gynecology Contact information: [address] [place] [address] [phone]           Newborn Data: Live born female named Dakari Birth Weight: 6 lb 14.1 oz (3121 g) APGAR: 8, 9  Newborn Delivery   Birth date/time: 12/01/2018 17:37:00 Delivery type: Vaginal, Spontaneous      Home with mother.  Kimberly Callahan, CNM 12/03/2018, 5:09 AM

## 2018-12-03 NOTE — Lactation Note (Signed)
This note was copied from a baby's chart. Lactation Consultation Note  Patient Name: Kimberly Callahan ENIDP'O Date: 12/03/2018 Reason for consult: Follow-up assessment;Primapara;Term Baby is 39 hours old/5% weight loss.  Mom reports that feedings are going well.  Baby cluster fed last night.  Breasts are full and leaking this morning.  No engorgement.  Discussed milk coming to volume and the prevention and treatment of engorgement.  Mom has a pump at home.  Observed baby latch easily in cradle hold.  Audible swallows noted.  Reviewed lactation services and encouraged to call prn.  Maternal Data    Feeding Feeding Type: Breast Fed  LATCH Score Latch: Grasps breast easily, tongue down, lips flanged, rhythmical sucking.  Audible Swallowing: Spontaneous and intermittent  Type of Nipple: Everted at rest and after stimulation  Comfort (Breast/Nipple): Soft / non-tender  Hold (Positioning): No assistance needed to correctly position infant at breast.  LATCH Score: 10  Interventions    Lactation Tools Discussed/Used     Consult Status Consult Status: Complete Follow-up type: Call as needed    Ave Filter 12/03/2018, 9:12 AM

## 2018-12-03 NOTE — Anesthesia Postprocedure Evaluation (Signed)
Anesthesia Post Note  Patient: Kimberly Callahan  Procedure(s) Performed: AN AD HOC LABOR EPIDURAL     Anesthesia Type: Epidural Cardiovascular status: blood pressure returned to baseline and stable Anesthetic complications: no Comments: Patient discharged prior to evaluation, per chart review patient was discharged in good condition with no apparent complications.    Last Vitals: There were no vitals filed for this visit.  Last Pain: There were no vitals filed for this visit.               Lidia Collum

## 2018-12-06 ENCOUNTER — Inpatient Hospital Stay (HOSPITAL_COMMUNITY): Admission: AD | Admit: 2018-12-06 | Payer: 59 | Source: Home / Self Care

## 2019-09-23 ENCOUNTER — Other Ambulatory Visit: Payer: Self-pay

## 2019-09-23 ENCOUNTER — Emergency Department (HOSPITAL_COMMUNITY): Payer: Federal, State, Local not specified - PPO

## 2019-09-23 ENCOUNTER — Emergency Department (HOSPITAL_COMMUNITY)
Admission: EM | Admit: 2019-09-23 | Discharge: 2019-09-24 | Disposition: A | Discharge status: Home / Self Care | Payer: Federal, State, Local not specified - PPO | Attending: Emergency Medicine | Admitting: Emergency Medicine

## 2019-09-23 ENCOUNTER — Encounter (HOSPITAL_COMMUNITY): Payer: Self-pay | Admitting: Emergency Medicine

## 2019-09-23 DIAGNOSIS — Z91040 Latex allergy status: Secondary | ICD-10-CM | POA: Diagnosis not present

## 2019-09-23 DIAGNOSIS — Y990 Civilian activity done for income or pay: Secondary | ICD-10-CM | POA: Diagnosis not present

## 2019-09-23 DIAGNOSIS — Y939 Activity, unspecified: Secondary | ICD-10-CM | POA: Insufficient documentation

## 2019-09-23 DIAGNOSIS — S9001XA Contusion of right ankle, initial encounter: Secondary | ICD-10-CM | POA: Diagnosis not present

## 2019-09-23 DIAGNOSIS — W208XXA Other cause of strike by thrown, projected or falling object, initial encounter: Secondary | ICD-10-CM | POA: Insufficient documentation

## 2019-09-23 DIAGNOSIS — Y92242 Post office as the place of occurrence of the external cause: Secondary | ICD-10-CM | POA: Insufficient documentation

## 2019-09-23 DIAGNOSIS — S99911A Unspecified injury of right ankle, initial encounter: Secondary | ICD-10-CM | POA: Diagnosis present

## 2019-09-23 NOTE — ED Triage Notes (Signed)
Pt c/o right ankle and foot swelling after injuring it at work.

## 2019-09-24 MED ORDER — NAPROXEN 500 MG PO TABS: 500.0000 mg | ORAL_TABLET | Freq: Two times a day (BID) | ORAL | 0 refills | Status: AC

## 2019-09-24 MED ORDER — NAPROXEN 250 MG PO TABS
500.0000 mg | ORAL_TABLET | Freq: Once | ORAL | Status: AC
  Administered 2019-09-24: 500 mg via ORAL
  Filled 2019-09-24: qty 2

## 2019-09-24 NOTE — ED Notes (Signed)
Discharge instructions discussed with pt. Pt verbalized understanding with no questions at this time.   Ace wrapped right ankle. Crutches provided. Education in regards to care discussed with pt with return demonstration.   Pt wheelchair to waiting room to wait on her friend.

## 2019-09-24 NOTE — ED Provider Notes (Signed)
[facility] EMERGENCY DEPARTMENT Provider Note   CSN: [id number] Arrival date & time: 09/23/19  2122     History Chief Complaint  Patient presents with  . Foot Injury    Kimberly Callahan is a 24 y.o. female.  Patient is a 24 year old female with a noncontributory past medical history presenting to emergency department for right ankle and foot injury which occurred around 7 PM.  Patient reports that she was at work, she works for the [employer] and a large roller cart rolled into the medial aspect of her right ankle and over her foot.  Unable to put weight on it since then.  No other injuries.        Past Medical History:  Diagnosis Date  . Allergy     Patient Active Problem List   Diagnosis Date Noted  . Status post vacuum-assisted vaginal delivery 12/02/2018  . Postpartum care following vaginal delivery 9/7 12/02/2018  . Perineal laceration, second degree 12/02/2018  . Normal labor and delivery 12/01/2018  . Hyperemesis gravidarum 10/20/2018  . Shortness of breath due to pregnancy in third trimester 10/20/2018  . Swelling of joint, knee, left 03/07/2015    Past Surgical History:  Procedure Laterality Date  . TONSILECTOMY, ADENOIDECTOMY, BILATERAL MYRINGOTOMY AND TUBES  2006-7     OB History    Gravida  1   Para  1   Term  1   Preterm      AB      Living  1     SAB      TAB      Ectopic      Multiple  0   Live Births  1           Family History  Problem Relation Age of Onset  . Hypertension Mother     Social History   Tobacco Use  . Smoking status: Never Smoker  . Smokeless tobacco: Never Used  Substance Use Topics  . Alcohol use: No    Alcohol/week: 0.0 standard drinks  . Drug use: No    Home Medications Prior to Admission medications   Medication Sig Start Date End Date Taking? Authorizing Provider  acetaminophen (TYLENOL) 325 MG tablet Take 2 tablets (650 mg total) by mouth every 4 (four) hours as needed  (for pain scale < 4). Patient not taking: Reported on 09/23/2019 12/03/18   Neta Mends, CNM  benzocaine-Menthol (DERMOPLAST) 20-0.5 % AERO Apply 1 application topically as needed for irritation (perineal discomfort). Patient not taking: Reported on 09/23/2019 12/03/18   Neta Mends, CNM  coconut oil OIL Apply 1 application topically as needed. Patient not taking: Reported on 09/23/2019 12/03/18   Neta Mends, CNM  ibuprofen (ADVIL) 600 MG tablet Take 1 tablet (600 mg total) by mouth every 6 (six) hours. Patient not taking: Reported on 09/23/2019 12/03/18   Neta Mends, CNM  Prenatal Vit-Fe Fumarate-FA (PRENATAL MULTIVITAMIN) TABS tablet Take 1 tablet by mouth daily at 12 noon. Patient not taking: Reported on 09/23/2019 12/03/18   Neta Mends, CNM    Allergies    Augmentin [amoxicillin-pot clavulanate], Penicillins, and Latex  Review of Systems   Review of Systems  Musculoskeletal: Positive for arthralgias and gait problem.  All other systems reviewed and are negative.   Physical Exam Updated Vital Signs BP 113/72 (BP Location: Right Arm)   Pulse 83   Temp 98 F (36.7 C) (Oral)   Resp 16   LMP 09/07/2019  SpO2 100%   Physical Exam Vitals and nursing note reviewed.  Constitutional:      General: She is not in acute distress.    Appearance: Normal appearance. She is not ill-appearing, toxic-appearing or diaphoretic.  HENT:     Head: Normocephalic.  Eyes:     Conjunctiva/sclera: Conjunctivae normal.  Cardiovascular:     Rate and Rhythm: Normal rate.  Pulmonary:     Effort: Pulmonary effort is normal.  Musculoskeletal:     Comments: The right foot is normal.  There is bruising and swelling to the medial right ankle with tenderness to palpation over the medial malleolus.  She has good range of motion but it is somewhat restricted with plantar flexion secondary to pain.  Normal Achilles tendon.  Skin:    General: Skin is dry.     Findings: Bruising present.    Neurological:     Mental Status: She is alert.  Psychiatric:        Mood and Affect: Mood normal.     ED Results / Procedures / Treatments   Labs (all labs ordered are listed, but only abnormal results are displayed) Labs Reviewed - No data to display  EKG None  Radiology DG Ankle Complete Right  Result Date: 09/23/2019 CLINICAL DATA:  Right foot injury, swelling EXAM: RIGHT ANKLE - COMPLETE 3+ VIEW COMPARISON:  None. FINDINGS: Medial soft tissue swelling. No acute bony abnormality. Specifically, no fracture, subluxation, or dislocation. Joint spaces maintained. IMPRESSION: No acute bony abnormality. Electronically Signed   By: Charlett Nose M.D.   On: 09/23/2019 22:15   DG Foot Complete Right  Result Date: 09/23/2019 CLINICAL DATA:  Right foot injury, swelling EXAM: RIGHT FOOT COMPLETE - 3+ VIEW COMPARISON:  None. FINDINGS: No acute bony abnormality. Specifically, no fracture, subluxation, or dislocation. Joint spaces maintained. Soft tissues unremarkable. IMPRESSION: Negative. Electronically Signed   By: Charlett Nose M.D.   On: 09/23/2019 22:17    Procedures Procedures (including critical care time)  Medications Ordered in ED Medications  naproxen (NAPROSYN) tablet 500 mg (has no administration in time range)    ED Course  I have reviewed the triage vital signs and the nursing notes.  Pertinent labs & imaging results that were available during my care of the patient were reviewed by me and considered in my medical decision making (see chart for details).  Clinical Course as of Sep 24 114  Thu Sep 24, 2019  0114 Patient with medial ankle injury after a roller cart rolled over it at work.  Currently unable to bear weight secondary to pain.  X-rays are reassuring.  She is neurovascularly intact with good pulses and capillary refill.  She was placed in an Ace wrap with crutches and advised to rest, ice, elevate.  If she is not improved by Monday she should follow-up with  Ortho.   [KM]    Clinical Course User Index [KM] Jeral Pinch   MDM Rules/Calculators/A&P                          Based on review of vitals, medical screening exam, lab work and/or imaging, there does not appear to be an acute, emergent etiology for the patient's symptoms. Counseled pt on good return precautions and encouraged both PCP and ED follow-up as needed.  Prior to discharge, I also discussed incidental imaging findings with patient in detail and advised appropriate, recommended follow-up in detail.  Clinical Impression: 1. Contusion of  right ankle, initial encounter     Disposition: Discharge  Prior to providing a prescription for a controlled substance, I independently reviewed the patient's recent prescription history on the [place] Controlled Substance Reporting System. The patient had no recent or regular prescriptions and was deemed appropriate for a brief, less than 3 day prescription of narcotic for acute analgesia.  This note was prepared with assistance of Conservation officer, historic buildings. Occasional wrong-word or sound-a-like substitutions may have occurred due to the inherent limitations of voice recognition software.  Final Clinical Impression(s) / ED Diagnoses Final diagnoses:  Contusion of right ankle, initial encounter    Rx / DC Orders ED Discharge Orders    None       Jeral Pinch 09/24/19 0118    Glynn Octave, MD 09/24/19 [id number]

## 2019-09-24 NOTE — Discharge Instructions (Addendum)
You are seen today for foot and ankle injury.  Your x-ray showed no sign of fracture or dislocation.  You will be sore with swelling and bruising over the next couple of days.  It may look worse before it looks better.  Please continue to wear the Ace wrap to help with the swelling and pain.  Use crutches as needed.  You can bear weight on your foot as you can tolerate it.  When you are not using it please keep it elevated and ice it 3 times a day for 20 minutes at a time.  We have prescribed you anti-inflammatory for pain and swelling.

## 2020-04-29 ENCOUNTER — Emergency Department (HOSPITAL_BASED_OUTPATIENT_CLINIC_OR_DEPARTMENT_OTHER)
Admission: EM | Admit: 2020-04-29 | Discharge: 2020-04-29 | Disposition: A | Discharge status: Home / Self Care | Attending: Emergency Medicine | Admitting: Emergency Medicine

## 2020-04-29 ENCOUNTER — Emergency Department (HOSPITAL_BASED_OUTPATIENT_CLINIC_OR_DEPARTMENT_OTHER)

## 2020-04-29 ENCOUNTER — Encounter (HOSPITAL_BASED_OUTPATIENT_CLINIC_OR_DEPARTMENT_OTHER): Payer: Self-pay | Admitting: Emergency Medicine

## 2020-04-29 ENCOUNTER — Other Ambulatory Visit: Payer: Self-pay

## 2020-04-29 DIAGNOSIS — W268XXA Contact with other sharp object(s), not elsewhere classified, initial encounter: Secondary | ICD-10-CM | POA: Insufficient documentation

## 2020-04-29 DIAGNOSIS — Y92242 Post office as the place of occurrence of the external cause: Secondary | ICD-10-CM | POA: Insufficient documentation

## 2020-04-29 DIAGNOSIS — S61312A Laceration without foreign body of right middle finger with damage to nail, initial encounter: Secondary | ICD-10-CM | POA: Diagnosis not present

## 2020-04-29 DIAGNOSIS — Z91040 Latex allergy status: Secondary | ICD-10-CM | POA: Insufficient documentation

## 2020-04-29 DIAGNOSIS — S6991XA Unspecified injury of right wrist, hand and finger(s), initial encounter: Secondary | ICD-10-CM | POA: Diagnosis present

## 2020-04-29 DIAGNOSIS — S6710XA Crushing injury of unspecified finger(s), initial encounter: Secondary | ICD-10-CM

## 2020-04-29 DIAGNOSIS — S61212A Laceration without foreign body of right middle finger without damage to nail, initial encounter: Secondary | ICD-10-CM

## 2020-04-29 MED ORDER — DOXYCYCLINE HYCLATE 100 MG PO TABS
100.0000 mg | ORAL_TABLET | Freq: Once | ORAL | Status: AC
  Administered 2020-04-29: 100 mg via ORAL
  Filled 2020-04-29: qty 1

## 2020-04-29 MED ORDER — LIDOCAINE HCL (PF) 1 % IJ SOLN
10.0000 mL | Freq: Once | INTRAMUSCULAR | Status: AC
  Administered 2020-04-29: 10 mL
  Filled 2020-04-29: qty 10

## 2020-04-29 MED ORDER — DOXYCYCLINE HYCLATE 100 MG PO CAPS: 100.0000 mg | ORAL_CAPSULE | Freq: Two times a day (BID) | ORAL | 0 refills | Status: DC

## 2020-04-29 NOTE — ED Provider Notes (Signed)
[facility] EMERGENCY DEPARTMENT Provider Note   CSN: [id number] Arrival date & time: 04/29/20  1849     History Chief Complaint  Patient presents with  . Extremity Laceration    Kimberly Callahan is a 25 y.o. female.  Patient presents emergency department for evaluation of right hand injury.  Patient was working at the [employer] when a piece of machinery came down on her hand causing lacerations to her right index and long fingers.  She complains of pain in these areas.  No treatments prior to arrival.  She drove herself to the emergency department.  No numbness or tingling.  Pain is worse with movement.  States that tetanus is up-to-date.        Past Medical History:  Diagnosis Date  . Allergy     Patient Active Problem List   Diagnosis Date Noted  . Status post vacuum-assisted vaginal delivery 12/02/2018  . Postpartum care following vaginal delivery 9/7 12/02/2018  . Perineal laceration, second degree 12/02/2018  . Normal labor and delivery 12/01/2018  . Hyperemesis gravidarum 10/20/2018  . Shortness of breath due to pregnancy in third trimester 10/20/2018  . Swelling of joint, knee, left 03/07/2015    Past Surgical History:  Procedure Laterality Date  . TONSILECTOMY, ADENOIDECTOMY, BILATERAL MYRINGOTOMY AND TUBES  2006-7     OB History    Gravida  1   Para  1   Term  1   Preterm      AB      Living  1     SAB      IAB      Ectopic      Multiple  0   Live Births  1           Family History  Problem Relation Age of Onset  . Hypertension Mother     Social History   Tobacco Use  . Smoking status: Never Smoker  . Smokeless tobacco: Never Used  Substance Use Topics  . Alcohol use: No    Alcohol/week: 0.0 standard drinks  . Drug use: No    Home Medications Prior to Admission medications   Medication Sig Start Date End Date Taking? Authorizing Provider  acetaminophen (TYLENOL) 325 MG tablet Take 2 tablets (650 mg total) by  mouth every 4 (four) hours as needed (for pain scale < 4). Patient not taking: Reported on 09/23/2019 12/03/18   Neta Mends, CNM  benzocaine-Menthol (DERMOPLAST) 20-0.5 % AERO Apply 1 application topically as needed for irritation (perineal discomfort). Patient not taking: Reported on 09/23/2019 12/03/18   Neta Mends, CNM  coconut oil OIL Apply 1 application topically as needed. Patient not taking: Reported on 09/23/2019 12/03/18   Neta Mends, CNM  ibuprofen (ADVIL) 600 MG tablet Take 1 tablet (600 mg total) by mouth every 6 (six) hours. Patient not taking: Reported on 09/23/2019 12/03/18   Neta Mends, CNM  Prenatal Vit-Fe Fumarate-FA (PRENATAL MULTIVITAMIN) TABS tablet Take 1 tablet by mouth daily at 12 noon. Patient not taking: Reported on 09/23/2019 12/03/18   Neta Mends, CNM    Allergies    Augmentin [amoxicillin-pot clavulanate], Penicillins, and Latex  Review of Systems   Review of Systems  Constitutional: Negative for activity change.  Musculoskeletal: Positive for arthralgias. Negative for joint swelling and neck pain.  Skin: Positive for wound.  Neurological: Negative for weakness and numbness.    Physical Exam Updated Vital Signs BP 120/78 (BP Location: Left Arm)  Pulse 75   Temp 98 F (36.7 C) (Oral)   Resp 16   Ht 5\' 4"  (1.626 m)   Wt 63.5 kg   SpO2 100%   BMI 24.03 kg/m   Physical Exam Vitals and nursing note reviewed.  Constitutional:      Appearance: She is well-developed and well-nourished.  HENT:     Head: Normocephalic and atraumatic.  Eyes:     Pupils: Pupils are equal, round, and reactive to light.  Cardiovascular:     Pulses: Normal pulses. No decreased pulses.  Musculoskeletal:        General: Tenderness present. No edema.     Cervical back: Normal range of motion and neck supple.     Comments: R index finger: Small superficial laceration, nongaping, not requiring repair, generalized tenderness, normal capillary refill  R long  finger: There is an irregular flap laceration (2cm) and a smaller approximately 8 mm, minimally gaping laceration.  Injury overlies the volar aspect of the finger at the proximal interphalangeal joint crease.  Wound is superficial and does not penetrate deeper into the finger.  No deep lacerations.  R ring finger: Mild tenderness, mild abrasion  Skin:    General: Skin is warm and dry.  Neurological:     Mental Status: She is alert.     Sensory: No sensory deficit.     Comments: Motor, sensation, and vascular distal to the injury is fully intact.   Psychiatric:        Mood and Affect: Mood and affect normal.     ED Results / Procedures / Treatments   Labs (all labs ordered are listed, but only abnormal results are displayed) Labs Reviewed - No data to display  EKG None  Radiology DG Hand Complete Right  Result Date: 04/29/2020 CLINICAL DATA:  Laceration posterior right hand and 3rd digit EXAM: RIGHT HAND - COMPLETE 3+ VIEW COMPARISON:  None. FINDINGS: No acute bony abnormality. Specifically, no fracture, subluxation, or dislocation. No radiopaque foreign body. Joint spaces maintained. IMPRESSION: No fracture or foreign body. Electronically Signed   By: 06/27/2020 M.D.   On: 04/29/2020 20:08    Procedures .04/04/2022Laceration Repair  Date/Time: 04/29/2020 8:44 PM Performed by: 06/27/2020, PA-C Authorized by: Renne Crigler, PA-C   Consent:    Consent obtained:  Verbal   Consent given by:  Patient   Risks discussed:  Infection, pain, nerve damage and vascular damage   Alternatives discussed:  No treatment Universal protocol:    Patient identity confirmed:  Verbally with patient, arm band and provided demographic data Anesthesia:    Anesthesia method:  Nerve block   Block location:  Right index finger   Block needle gauge:  25 G   Block anesthetic:  Lidocaine 1% w/o epi   Block technique:  3 sided ring block   Block injection procedure:  Anatomic landmarks identified,  introduced needle, incremental injection, negative aspiration for blood and anatomic landmarks palpated   Block outcome:  Anesthesia achieved Laceration details:    Location:  Finger   Finger location:  R long finger   Length (cm):  3 Pre-procedure details:    Preparation:  Patient was prepped and draped in usual sterile fashion and imaging obtained to evaluate for foreign bodies Exploration:    Wound exploration: wound explored through full range of motion and entire depth of wound visualized     Contaminated: no   Treatment:    Area cleansed with:  Saline   Amount of cleaning:  Standard   Debridement:  Minimal   Undermining:  None Skin repair:    Repair method:  Sutures   Suture size:  5-0   Suture material:  Nylon   Suture technique:  Simple interrupted   Number of sutures:  3 Repair type:    Repair type:  Simple Post-procedure details:    Dressing:  Open (no dressing)   Procedure completion:  Tolerated     Medications Ordered in ED Medications - No data to display  ED Course  I have reviewed the triage vital signs and the nursing notes.  Pertinent labs & imaging results that were available during my care of the patient were reviewed by me and considered in my medical decision making (see chart for details).   Patient seen and examined. Will need x-ray, digital block prior to evaluation of right index and middle finger wounds.   Vital signs reviewed and are as follows: BP 120/78 (BP Location: Left Arm)   Pulse 75   Temp 98 F (36.7 C) (Oral)   Resp 16   Ht 5\' 4"  (1.626 m)   Wt 63.5 kg   SpO2 100%   BMI 24.03 kg/m   8:34 PM X-ray reviewed. Wound repaired as best as possible given flap nature of wound. Discussed risk of infection and nerve injury due to crush type injury.  Patient be given a dose of doxycycline (pen allergic) due to nature of wound and give additional prescription for 5 total days.    MDM Rules/Calculators/A&P                          Crush  injury of right hand.  X-rays negative for foreign body.  Wound repaired as best as possible.  No significant neurologic, vascular compromise noted.   Final Clinical Impression(s) / ED Diagnoses Final diagnoses:  Crushing injury of finger, initial encounter  Laceration of right middle finger without foreign body without damage to nail, initial encounter    Rx / DC Orders ED Discharge Orders         Ordered    doxycycline (VIBRAMYCIN) 100 MG capsule  2 times daily        04/29/20 2040           2041, Renne Crigler 04/29/20 2045    2046, MD 04/29/20 2300

## 2020-04-29 NOTE — ED Notes (Signed)
Wound irrigated and cleansed by this RN.

## 2020-04-29 NOTE — Discharge Instructions (Signed)
Please read and follow all provided instructions.  Your diagnoses today include:  1. Crushing injury of finger, initial encounter   2. Laceration of right middle finger without foreign body without damage to nail, initial encounter     Tests performed today include:  X-ray of the affected area that did not show any foreign bodies or broken bones  Vital signs. See below for your results today.   Medications prescribed:  Please use over-the-counter NSAID medications (ibuprofen, naproxen) as directed on the packaging for pain.    Doxycycline - antibiotic  You have been prescribed an antibiotic medicine: take the entire course of medicine even if you are feeling better. Stopping early can cause the antibiotic not to work.  Take any prescribed medications only as directed.   Home care instructions:  Follow any educational materials and wound care instructions contained in this packet.   Keep affected area above the level of your heart when possible to minimize swelling. Wash area gently twice a day with warm soapy water. Do not apply alcohol or hydrogen peroxide. Cover the area if it draining or weeping.   Follow-up instructions: Suture Removal: Return to the Emergency Department or see your primary care care doctor in 10 days for a recheck of your wound and removal of your sutures or staples.    Return instructions:  Return to the Emergency Department if you have:  Fever  Worsening pain  Worsening swelling of the wound  Pus draining from the wound  Redness of the skin that moves away from the wound, especially if it streaks away from the affected area   Any other emergent concerns  Your vital signs today were: BP 120/78 (BP Location: Left Arm)   Pulse 75   Temp 98 F (36.7 C) (Oral)   Resp 16   Ht 5\' 4"  (1.626 m)   Wt 63.5 kg   SpO2 100%   BMI 24.03 kg/m  If your blood pressure (BP) was elevated above 135/85 this visit, please have this repeated by your doctor  within one month. --------------

## 2020-04-29 NOTE — ED Notes (Signed)
Patient verbalizes understanding of discharge instructions. Opportunity for questioning and answers were provided. Armband removed by staff, pt discharged from ED ambulatory to home.  

## 2020-04-29 NOTE — ED Triage Notes (Signed)
Reports she cut her middle and pointer finger on the right hand on a machine at work.

## 2021-02-02 ENCOUNTER — Emergency Department (HOSPITAL_BASED_OUTPATIENT_CLINIC_OR_DEPARTMENT_OTHER): Payer: Federal, State, Local not specified - PPO

## 2021-02-02 ENCOUNTER — Other Ambulatory Visit: Payer: Self-pay

## 2021-02-02 ENCOUNTER — Encounter (HOSPITAL_BASED_OUTPATIENT_CLINIC_OR_DEPARTMENT_OTHER): Payer: Self-pay | Admitting: *Deleted

## 2021-02-02 ENCOUNTER — Emergency Department (HOSPITAL_BASED_OUTPATIENT_CLINIC_OR_DEPARTMENT_OTHER)
Admission: EM | Admit: 2021-02-02 | Discharge: 2021-02-02 | Disposition: A | Discharge status: Home / Self Care | Payer: Federal, State, Local not specified - PPO | Attending: Emergency Medicine | Admitting: Emergency Medicine

## 2021-02-02 DIAGNOSIS — R519 Headache, unspecified: Secondary | ICD-10-CM | POA: Diagnosis present

## 2021-02-02 DIAGNOSIS — G43009 Migraine without aura, not intractable, without status migrainosus: Secondary | ICD-10-CM

## 2021-02-02 DIAGNOSIS — Z91040 Latex allergy status: Secondary | ICD-10-CM | POA: Diagnosis not present

## 2021-02-02 DIAGNOSIS — G43909 Migraine, unspecified, not intractable, without status migrainosus: Secondary | ICD-10-CM | POA: Diagnosis not present

## 2021-02-02 LAB — PREGNANCY, URINE: Preg Test, Ur: NEGATIVE

## 2021-02-02 MED ORDER — DEXAMETHASONE SODIUM PHOSPHATE 10 MG/ML IJ SOLN
10.0000 mg | Freq: Once | INTRAMUSCULAR | Status: AC
  Administered 2021-02-02: 10 mg via INTRAMUSCULAR
  Filled 2021-02-02: qty 1

## 2021-02-02 MED ORDER — KETOROLAC TROMETHAMINE 30 MG/ML IJ SOLN
30.0000 mg | Freq: Once | INTRAMUSCULAR | Status: AC
  Administered 2021-02-02: 30 mg via INTRAMUSCULAR
  Filled 2021-02-02: qty 1

## 2021-02-02 NOTE — ED Triage Notes (Signed)
HA x 2 weeks.  Vomited x 1.  Tylenol taken at 4pm.

## 2021-02-02 NOTE — ED Provider Notes (Signed)
[facility] EMERGENCY DEPARTMENT Provider Note   CSN: [id number] Arrival date & time: 02/02/21  2046     History Chief Complaint  Patient presents with   Migraine    Kimberly Callahan is a 25 y.o. female.  25 year old female history of migraines and tension headaches, migraine onset last night while at work, persisted today is different from previous, with right side tinnitus (lasts for a minutes at a time) and vomiting. Gradual onset, no family history of aneurism. Typically manages headaches with Tylenol and Excedrin. Reports feeling generally weak, 1 episode of vomiting, no longer nauseous. Also photophobia. Pain located left frontal/periorbital, typical location for her. No changes in vision, speech, gait.      Past Medical History:  Diagnosis Date   Allergy     Patient Active Problem List   Diagnosis Date Noted   Status post vacuum-assisted vaginal delivery 12/02/2018   Postpartum care following vaginal delivery 9/7 12/02/2018   Perineal laceration, second degree 12/02/2018   Normal labor and delivery 12/01/2018   Hyperemesis gravidarum 10/20/2018   Shortness of breath due to pregnancy in third trimester 10/20/2018   Swelling of joint, knee, left 03/07/2015    Past Surgical History:  Procedure Laterality Date   TONSILECTOMY, ADENOIDECTOMY, BILATERAL MYRINGOTOMY AND TUBES  2006-7     OB History     Gravida  1   Para  1   Term  1   Preterm      AB      Living  1      SAB      IAB      Ectopic      Multiple  0   Live Births  1           Family History  Problem Relation Age of Onset   Hypertension Mother     Social History   Tobacco Use   Smoking status: Never   Smokeless tobacco: Never  Substance Use Topics   Alcohol use: No    Alcohol/week: 0.0 standard drinks   Drug use: No    Home Medications Prior to Admission medications   Not on File    Allergies    Augmentin [amoxicillin-pot clavulanate], Penicillins, and  Latex  Review of Systems   Review of Systems  Constitutional:  Negative for fever.  HENT:  Negative for congestion.   Eyes:  Positive for photophobia. Negative for visual disturbance.  Respiratory:  Negative for cough.   Gastrointestinal:  Positive for nausea and vomiting. Negative for abdominal pain.  Musculoskeletal:  Negative for neck pain and neck stiffness.  Skin:  Negative for rash.  Allergic/Immunologic: Negative for immunocompromised state.  Neurological:  Positive for weakness and headaches. Negative for speech difficulty and numbness.  Hematological:  Does not bruise/bleed easily.  Psychiatric/Behavioral:  Negative for confusion.   All other systems reviewed and are negative.  Physical Exam Updated Vital Signs BP 110/76 (BP Location: Right Arm)   Pulse 84   Temp 98.4 F (36.9 C) (Oral)   Resp 16   Ht 5\' 4"  (1.626 m)   Wt 67.1 kg   LMP 01/12/2021 (Exact Date)   SpO2 100%   BMI 25.40 kg/m   Physical Exam Vitals and nursing note reviewed.  Constitutional:      General: She is not in acute distress.    Appearance: She is well-developed. She is not diaphoretic.  HENT:     Head: Normocephalic and atraumatic.  Pulmonary:  Effort: Pulmonary effort is normal.  Skin:    General: Skin is warm and dry.     Findings: No erythema or rash.  Neurological:     Mental Status: She is alert and oriented to person, place, and time.     Cranial Nerves: No cranial nerve deficit.     Sensory: No sensory deficit.     Motor: No weakness.     Coordination: Romberg sign negative. Coordination normal. Heel to Shin Test normal.     Gait: Gait normal.  Psychiatric:        Behavior: Behavior normal.    ED Results / Procedures / Treatments   Labs (all labs ordered are listed, but only abnormal results are displayed) Labs Reviewed  PREGNANCY, URINE    EKG None  Radiology CT Head Wo Contrast  Result Date: 02/02/2021 CLINICAL DATA:  Headache. EXAM: CT HEAD WITHOUT  CONTRAST TECHNIQUE: Contiguous axial images were obtained from the base of the skull through the vertex without intravenous contrast. COMPARISON:  None. FINDINGS: Brain: No evidence of acute infarction, hemorrhage, hydrocephalus, extra-axial collection or mass lesion/mass effect. Vascular: No hyperdense vessel or unexpected calcification. Skull: Normal. Negative for fracture or focal lesion. Sinuses/Orbits: No acute finding. Other: None IMPRESSION: Normal noncontrast CT of the brain. Electronically Signed   By: Elgie Collard M.D.   On: 02/02/2021 22:46    Procedures Procedures   Medications Ordered in ED Medications  ketorolac (TORADOL) 30 MG/ML injection 30 mg (30 mg Intramuscular Given 02/02/21 2304)  dexamethasone (DECADRON) injection 10 mg (10 mg Intramuscular Given 02/02/21 2304)    ED Course  I have reviewed the triage vital signs and the nursing notes.  Pertinent labs & imaging results that were available during my care of the patient were reviewed by me and considered in my medical decision making (see chart for details).  Clinical Course as of 02/02/21 2332  Thu Feb 02, 2021  [id number] 25 year old female with complaint of headache.  Reports regular migraines however having vomiting with this episode and tinnitus in the right ear.  Not currently having tinnitus, no longer vomiting.  Exam is overall reassuring however due to history of migraines, change in her baseline migraine symptoms and no prior imaging, CT head is obtained after negative pregnancy test.  CT is unremarkable.  Patient is given Toradol and Decadron for her headache.  Advised to follow-up with PCP, given referral to neurology.  Return to ER for any worsening or concerning symptoms. [LM]    Clinical Course User Index [LM] Alden Hipp   MDM Rules/Calculators/A&P                           Final Clinical Impression(s) / ED Diagnoses Final diagnoses:  Migraine without aura and without status migrainosus, not  intractable    Rx / DC Orders ED Discharge Orders     None        Jeannie Fend, PA-C 02/02/21 2332    Melene Plan, DO 02/03/21 1510

## 2021-02-02 NOTE — Discharge Instructions (Addendum)
Recheck with your primary care provider or see neurology, call to schedule appointment.  Return to emergency room for any worsening or concerning symptoms.

## 2021-02-05 NOTE — Progress Notes (Signed)
NEUROLOGY CONSULTATION NOTE  Kimberly Callahan MRN: [id number] DOB: June 27, 1995  Referring provider: Army Melia, PA-C (ED referral) Primary care provider: No PCP  Reason for consult:  migraine  Assessment/Plan:   Migraine without aura, without status migrainosus, intractable vs cluster headache, intractable - autonomic features suggestive of cluster however headache and symptoms are bilateral and autonomic symptoms may be seen in migraine as well  Start topiramate 25mg  at bedtime for one week, then increase to 50mg  at bedtime.  Discussed teratogenic side effects (not to get pregnant) as well as other potential side effects such as cognitive clouding, paresthesias, kidney stones (hydrate) Migraine rescue:  rizatriptan 10mg  and Zofran 10mg .  Stop OTC analgesics. Limit use of pain relievers to no more than 2 days out of week to prevent risk of rebound or medication-overuse headache. Keep headache diary Follow up 6 months.    Subjective:  Kimberly Callahan is a 25 year old female who presents for migraines.  History supplemented by ED note.  Onset:  25 years old Location:  starts left temporal radiating to bilateral retro-orbital and band-like Quality:  pounding, pressure Intensity:  8/10 Aura:  absent Prodrome:  absent Associated symptoms:  Nausea, photophobia, dizziness, bilateral conjunctival injections, bilateral eye lacrimation, rhinorrhea, rarely vomiting.  She denies associated visual changes, or unilateral numbness or weakness. Duration:  2 1/2 to 3 days Frequency:  over past year 1-2 times a week Frequency of abortive medication: Tylenol or Excedrin 2-3 days a week Triggers:  Nexplanon, pregnancy Relieving factors:  Sleep Activity:  movement aggravates  She presented to the ED on 02/02/2021 due to experiencing change in migraine, presenting with right-sided tinnitus.  CT head personally reviewed was normal.  She was treated with a headache cocktail.  Current  NSAIDS/analgesics:  Excedrin, Tylenol Current triptans:  none Current ergotamine:  none Current anti-emetic:  none Current muscle relaxants:  none Current Antihypertensive medications:  none Current Antidepressant medications:  none Current Anticonvulsant medications:  none Current anti-CGRP:  none Current Vitamins/Herbal/Supplements:  none Current Antihistamines/Decongestants:  none Other therapy:  none Hormone/birth control:  none  Past NSAIDS/analgesics:  Midol Past abortive triptans:  none Past abortive ergotamine:  none Past muscle relaxants:  none Past anti-emetic:  none Past antihypertensive medications:  none Past antidepressant medications:  none Past anticonvulsant medications:  none Past anti-CGRP:  none Past vitamins/Herbal/Supplements:  none Past antihistamines/decongestants:  none Other past therapies:  none  Caffeine:  no  Depression:  no; Anxiety:  no Other pain:  no Sleep hygiene:  trouble falling asleep -mind races Family history of headache:  no      PAST MEDICAL HISTORY: Past Medical History:  Diagnosis Date   Allergy     PAST SURGICAL HISTORY: Past Surgical History:  Procedure Laterality Date   TONSILECTOMY, ADENOIDECTOMY, BILATERAL MYRINGOTOMY AND TUBES  2006-7    MEDICATIONS: No medication   ALLERGIES: Allergies  Allergen Reactions   Augmentin [Amoxicillin-Pot Clavulanate] Hives   Penicillins Hives   Latex Rash    FAMILY HISTORY: Family History  Problem Relation Age of Onset   Hypertension Mother     Objective:  Blood pressure 109/73, pulse 96, height 5\' 4"  (1.626 m), weight 140 lb (63.5 kg), last menstrual period 01/12/2021, SpO2 98 %, unknown if currently breastfeeding. General: No acute distress.  Patient appears well-groomed.   Head:  Normocephalic/atraumatic Eyes:  fundi examined but not visualized Neck: supple, no paraspinal tenderness, full range of motion Back: No paraspinal tenderness Heart: regular rate and  rhythm  Lungs: Clear to auscultation bilaterally. Vascular: No carotid bruits. Neurological Exam: Mental status: alert and oriented to person, place, and time, recent and remote memory intact, fund of knowledge intact, attention and concentration intact, speech fluent and not dysarthric, language intact. Cranial nerves: CN I: not tested CN II: pupils equal, round and reactive to light, visual fields intact CN III, IV, VI:  full range of motion, no nystagmus, no ptosis CN V: facial sensation intact. CN VII: upper and lower face symmetric CN VIII: hearing intact CN IX, X: gag intact, uvula midline CN XI: sternocleidomastoid and trapezius muscles intact CN XII: tongue midline Bulk & Tone: normal, no fasciculations. Motor:  muscle strength 5/5 throughout Sensation:  Pinprick, temperature and vibratory sensation intact. Deep Tendon Reflexes:  2+ throughout,  toes downgoing.   Finger to nose testing:  Without dysmetria.   Heel to shin:  Without dysmetria.   Gait:  Normal station and stride.  Romberg negative.    Thank you for allowing me to take part in the care of this patient.  Shon Millet, DO

## 2021-02-06 ENCOUNTER — Encounter: Payer: Self-pay | Admitting: Neurology

## 2021-02-06 ENCOUNTER — Other Ambulatory Visit: Payer: Self-pay

## 2021-02-06 ENCOUNTER — Ambulatory Visit (INDEPENDENT_AMBULATORY_CARE_PROVIDER_SITE_OTHER): Payer: Federal, State, Local not specified - PPO | Admitting: Neurology

## 2021-02-06 VITALS — BP 109/73 | HR 96 | Ht 64.0 in | Wt 140.0 lb

## 2021-02-06 DIAGNOSIS — G43019 Migraine without aura, intractable, without status migrainosus: Secondary | ICD-10-CM

## 2021-02-06 MED ORDER — TOPIRAMATE 50 MG PO TABS: ORAL_TABLET | ORAL | 0 refills | Status: DC

## 2021-02-06 MED ORDER — RIZATRIPTAN BENZOATE 10 MG PO TBDP: ORAL_TABLET | ORAL | 5 refills | Status: DC

## 2021-02-06 MED ORDER — ONDANSETRON 4 MG PO TBDP: 4.0000 mg | ORAL_TABLET | Freq: Three times a day (TID) | ORAL | 5 refills | Status: DC | PRN

## 2021-02-06 NOTE — Patient Instructions (Signed)
  Start topiramate 50mg  tablet - take 1/2 tablet at bedtime for one week, then 1 tablet at bedtime.  Contact in 6 weeks with update and we can increase dose if needed. Take rizatriptan 10mg  at earliest onset of headache.  May repeat dose once in 2 hours if needed.  Maximum 2 tablets in 24 hours. Take ondansetron for nausea Limit use of pain relievers to no more than 2 days out of the week.  These medications include acetaminophen, NSAIDs (ibuprofen/Advil/Motrin, naproxen/Aleve, triptans (Imitrex/sumatriptan), Excedrin, and narcotics.  This will help reduce risk of rebound headaches. Be aware of common food triggers:  - Caffeine:  coffee, black tea, cola, Mt. Dew  - Chocolate  - Dairy:  aged cheeses (brie, blue, cheddar, gouda, [place], provolone, Evergreen, Swiss, etc), chocolate milk, buttermilk, sour cream, limit eggs and yogurt  - Nuts, peanut butter  - Alcohol  - Cereals/grains:  FRESH breads (fresh bagels, sourdough, doughnuts), yeast productions  - Processed/canned/aged/cured meats (pre-packaged deli meats, hotdogs)  - MSG/glutamate:  soy sauce, flavor enhancer, pickled/preserved/marinated foods  - Sweeteners:  aspartame (Equal, Nutrasweet).  Sugar and Splenda are okay  - Vegetables:  legumes (lima beans, lentils, snow peas, fava beans, pinto peans, peas, garbanzo beans), sauerkraut, onions, olives, pickles  - Fruit:  avocados, bananas, citrus fruit (orange, lemon, grapefruit), mango  - Other:  Frozen meals, macaroni and cheese Routine exercise Stay adequately hydrated (aim for 64 oz water daily) Keep headache diary Maintain proper stress management Maintain proper sleep hygiene Do not skip meals Consider supplements:  magnesium citrate 400mg  daily, riboflavin 400mg  daily, coenzyme Q10 100mg  three times daily.

## 2021-02-21 ENCOUNTER — Ambulatory Visit: Payer: Federal, State, Local not specified - PPO | Admitting: Podiatry

## 2021-03-13 ENCOUNTER — Encounter (HOSPITAL_BASED_OUTPATIENT_CLINIC_OR_DEPARTMENT_OTHER): Payer: Self-pay

## 2021-03-13 ENCOUNTER — Other Ambulatory Visit: Payer: Self-pay

## 2021-03-13 ENCOUNTER — Emergency Department (HOSPITAL_BASED_OUTPATIENT_CLINIC_OR_DEPARTMENT_OTHER)
Admission: EM | Admit: 2021-03-13 | Discharge: 2021-03-13 | Disposition: A | Discharge status: Home / Self Care | Payer: Federal, State, Local not specified - PPO | Attending: Emergency Medicine | Admitting: Emergency Medicine

## 2021-03-13 ENCOUNTER — Other Ambulatory Visit (HOSPITAL_BASED_OUTPATIENT_CLINIC_OR_DEPARTMENT_OTHER): Payer: Self-pay

## 2021-03-13 ENCOUNTER — Emergency Department (HOSPITAL_BASED_OUTPATIENT_CLINIC_OR_DEPARTMENT_OTHER): Payer: Federal, State, Local not specified - PPO

## 2021-03-13 DIAGNOSIS — Z91040 Latex allergy status: Secondary | ICD-10-CM | POA: Insufficient documentation

## 2021-03-13 DIAGNOSIS — N9489 Other specified conditions associated with female genital organs and menstrual cycle: Secondary | ICD-10-CM | POA: Insufficient documentation

## 2021-03-13 DIAGNOSIS — O4690 Antepartum hemorrhage, unspecified, unspecified trimester: Secondary | ICD-10-CM

## 2021-03-13 DIAGNOSIS — O26851 Spotting complicating pregnancy, first trimester: Secondary | ICD-10-CM | POA: Diagnosis not present

## 2021-03-13 DIAGNOSIS — Z3A01 Less than 8 weeks gestation of pregnancy: Secondary | ICD-10-CM | POA: Diagnosis not present

## 2021-03-13 DIAGNOSIS — O2341 Unspecified infection of urinary tract in pregnancy, first trimester: Secondary | ICD-10-CM

## 2021-03-13 LAB — COMPREHENSIVE METABOLIC PANEL
ALT: 11 U/L (ref 0–44)
AST: 12 U/L — ABNORMAL LOW (ref 15–41)
Albumin: 4.1 g/dL (ref 3.5–5.0)
Alkaline Phosphatase: 49 U/L (ref 38–126)
Anion gap: 7 (ref 5–15)
BUN: 17 mg/dL (ref 6–20)
CO2: 25 mmol/L (ref 22–32)
Calcium: 8.8 mg/dL — ABNORMAL LOW (ref 8.9–10.3)
Chloride: 107 mmol/L (ref 98–111)
Creatinine, Ser: 0.71 mg/dL (ref 0.44–1.00)
GFR, Estimated: >60 mL/min (ref >60)
Glucose, Bld: 83 mg/dL (ref 70–99)
Potassium: 3.7 mmol/L (ref 3.5–5.1)
Sodium: 139 mmol/L (ref 135–145)
Total Bilirubin: 0.4 mg/dL (ref 0.3–1.2)
Total Protein: 7.4 g/dL (ref 6.5–8.1)

## 2021-03-13 LAB — URINALYSIS, ROUTINE W REFLEX MICROSCOPIC
Bilirubin Urine: NEGATIVE
Glucose, UA: NEGATIVE mg/dL
Hgb urine dipstick: NEGATIVE
Ketones, ur: NEGATIVE mg/dL
Nitrite: NEGATIVE
Protein, ur: NEGATIVE mg/dL
Specific Gravity, Urine: >=1.03 (ref 1.005–1.030)
pH: 6.5 (ref 5.0–8.0)

## 2021-03-13 LAB — CBC
HCT: 40.5 % (ref 36.0–46.0)
Hemoglobin: 13.5 g/dL (ref 12.0–15.0)
MCH: 31.1 pg (ref 26.0–34.0)
MCHC: 33.3 g/dL (ref 30.0–36.0)
MCV: 93.3 fL (ref 80.0–100.0)
Platelets: 226 10*3/uL (ref 150–400)
RBC: 4.34 MIL/uL (ref 3.87–5.11)
RDW: 12.8 % (ref 11.5–15.5)
WBC: 8.3 10*3/uL (ref 4.0–10.5)
nRBC: 0 % (ref 0.0–0.2)

## 2021-03-13 LAB — URINALYSIS, MICROSCOPIC (REFLEX)

## 2021-03-13 LAB — ABO/RH: ABO/RH(D): A POS

## 2021-03-13 LAB — HCG, QUANTITATIVE, PREGNANCY: hCG, Beta Chain, Quant, S: 12326 m[IU]/mL — ABNORMAL HIGH (ref <5)

## 2021-03-13 MED ORDER — CEPHALEXIN 500 MG PO CAPS
500.0000 mg | ORAL_CAPSULE | Freq: Four times a day (QID) | ORAL | 0 refills | Status: AC
  Filled 2021-03-13: qty 20, 5d supply, fill #0

## 2021-03-13 NOTE — ED Provider Notes (Signed)
[facility] EMERGENCY DEPARTMENT Provider Note   CSN: [id number] Arrival date & time: 03/13/21  [id number]     History Chief Complaint  Patient presents with   Vaginal Bleeding    Kimberly Callahan is a 25 y.o. female.  Patient with no pertinent past medical history presents today with complaint of vaginal bleeding.  She states that she she has approximately [redacted] weeks pregnant confirmed on home pregnancy test.  Last menstrual cycle was November 13.  She has not yet seen OB/GYN.  She states that this morning she woke up with cramping in her lower abdomen followed by 2 episodes of moderate amount of bleeding with clots present in the toilet. No obvious POC present.  No fevers or chills, nausea, vomiting, or diarrhea.  She is G2P1 with previous pregnancy without complication.   Vaginal Bleeding Associated symptoms: no dysuria, no fever, no nausea and no vaginal discharge       Past Medical History:  Diagnosis Date   Allergy     Patient Active Problem List   Diagnosis Date Noted   Status post vacuum-assisted vaginal delivery 12/02/2018   Postpartum care following vaginal delivery 9/7 12/02/2018   Perineal laceration, second degree 12/02/2018   Normal labor and delivery 12/01/2018   Hyperemesis gravidarum 10/20/2018   Shortness of breath due to pregnancy in third trimester 10/20/2018   Swelling of joint, knee, left 03/07/2015    Past Surgical History:  Procedure Laterality Date   TONSILECTOMY, ADENOIDECTOMY, BILATERAL MYRINGOTOMY AND TUBES  2006-7     OB History     Gravida  2   Para  1   Term  1   Preterm      AB      Living  1      SAB      IAB      Ectopic      Multiple  0   Live Births  1           Family History  Problem Relation Age of Onset   Hypertension Mother     Social History   Tobacco Use   Smoking status: Never   Smokeless tobacco: Never  Vaping Use   Vaping Use: Never used  Substance Use Topics   Alcohol use: No     Alcohol/week: 0.0 standard drinks   Drug use: No    Home Medications Prior to Admission medications   Medication Sig Start Date End Date Taking? Authorizing Provider  ondansetron (ZOFRAN ODT) 4 MG disintegrating tablet Take 1 tablet (4 mg total) by mouth every 8 (eight) hours as needed for nausea or vomiting. 02/06/21   Pieter Partridge, DO  rizatriptan (MAXALT-MLT) 10 MG disintegrating tablet Take 1 tablet earliest onset of headache.  May repeat in 2 hours if needed.  Maximum 2 tablets in 24 hours 02/06/21   Pieter Partridge, DO  topiramate (TOPAMAX) 50 MG tablet Take 1/2 tablet at bedtime for one week, then increase to 1 tablet at bedtime 02/06/21   Pieter Partridge, DO    Allergies    Augmentin [amoxicillin-pot clavulanate], Penicillins, and Latex  Review of Systems   Review of Systems  Constitutional:  Negative for chills and fever.  Gastrointestinal:  Negative for diarrhea, nausea and vomiting.  Genitourinary:  Positive for pelvic pain and vaginal bleeding. Negative for decreased urine volume, difficulty urinating, dysuria, flank pain, frequency, hematuria, urgency, vaginal discharge and vaginal pain.  All other systems reviewed and are negative.  Physical  Exam Updated Vital Signs BP 96/61    Pulse 79    Temp 98.2 F (36.8 C) (Oral)    Resp 18    Ht 5\' 4"  (1.626 m)    Wt 63.5 kg    LMP 02/09/2021    SpO2 100%    BMI 24.03 kg/m   Physical Exam Vitals and nursing note reviewed.  Constitutional:      General: She is not in acute distress.    Appearance: Normal appearance. She is normal weight. She is not ill-appearing, toxic-appearing or diaphoretic.     Comments: Patient resting comfortably in bed in no acute distress  HENT:     Head: Normocephalic and atraumatic.  Eyes:     Extraocular Movements: Extraocular movements intact.  Cardiovascular:     Rate and Rhythm: Normal rate and regular rhythm.     Heart sounds: Normal heart sounds.  Pulmonary:     Effort: Pulmonary effort is  normal. No respiratory distress.     Breath sounds: Normal breath sounds.  Abdominal:     General: Abdomen is flat.     Palpations: Abdomen is soft.     Tenderness: There is abdominal tenderness in the suprapubic area.  Musculoskeletal:        General: Normal range of motion.     Cervical back: Normal range of motion.  Skin:    General: Skin is warm and dry.  Neurological:     General: No focal deficit present.     Mental Status: She is alert.  Psychiatric:        Mood and Affect: Mood normal.        Behavior: Behavior normal.    ED Results / Procedures / Treatments   Labs (all labs ordered are listed, but only abnormal results are displayed) Labs Reviewed  HCG, QUANTITATIVE, PREGNANCY - Abnormal; Notable for the following components:      Result Value   hCG, Beta Chain, Quant, S 12,326 (*)    All other components within normal limits  URINALYSIS, ROUTINE W REFLEX MICROSCOPIC - Abnormal; Notable for the following components:   APPearance HAZY (*)    Leukocytes,Ua TRACE (*)    All other components within normal limits  COMPREHENSIVE METABOLIC PANEL - Abnormal; Notable for the following components:   Calcium 8.8 (*)    AST 12 (*)    All other components within normal limits  URINALYSIS, MICROSCOPIC (REFLEX) - Abnormal; Notable for the following components:   Bacteria, UA MANY (*)    All other components within normal limits  CBC  ABO/RH    EKG None  Radiology 02/11/2021 OB LESS THAN 14 WEEKS WITH OB TRANSVAGINAL  Result Date: 03/13/2021 CLINICAL DATA:  Vaginal bleeding EXAM: OBSTETRIC <14 WK 03/15/2021 AND TRANSVAGINAL OB US TECHNIQUE: Both transabdominal and transvaginal ultrasound examinations were performed for complete evaluation of the gestation as well as the maternal uterus, adnexal regions, and pelvic cul-de-sac. Transvaginal technique was performed to assess early pregnancy. COMPARISON:  None. FINDINGS: Intrauterine gestational sac: Single Yolk sac:  Visualized. Embryo:  Not  Visualized. Cardiac Activity: Not Visualized. MSD: 9 mm   5 w   5 d Subchorionic hemorrhage:  None visualized. Maternal uterus/adnexae: Unremarkable. IMPRESSION: Intrauterine gestational sac which correlates with 5 week 5 day gestation by size. No fetal pole identified, possibly secondary to early pregnancy. Consider follow-up ultrasound in 10-14 days. Electronically Signed   By: 12-19[birth year]1978 M.D.   On: 03/13/2021 13:55    Procedures Procedures  Medications Ordered in ED Medications - No data to display  ED Course  I have reviewed the triage vital signs and the nursing notes.  Pertinent labs & imaging results that were available during my care of the patient were reviewed by me and considered in my medical decision making (see chart for details).    MDM Rules/Calculators/A&P                         Patient [redacted] weeks pregnant presents today with vaginal bleeding and cramping that started this morning. She states that she passed 2 clots present in the toilet this morning and then bleeding resolved.  US reveals patient is 5 weeks 5 days pregnant by size, no fetal pole or cardiac activity visualized, however this could be due to early pregnancy.  Recommends follow-up ultrasound in 10 to 14 days.  Yolk sac is present in the uterus.  No concerns for ectopic pregnancy.  No abnormalities visualized.  CBC reveals no leukocytosis or anemia.  UA with signs of infection, will treat for same.  No other abnormalities identified.  She is afebrile, nontoxic-appearing, and in no acute distress.  Upon reassessment, patient states that her symptoms have improved.  Plan to discharge with OB follow-up and ultrasound in 2 weeks.  Patient amenable with plan.  Educated on red flag symptoms that would prompt immediate return.  Discharged in stable condition.   Final Clinical Impression(s) / ED Diagnoses Final diagnoses:  Vaginal bleeding during pregnancy    Rx / DC Orders ED Discharge Orders           Ordered    cephALEXin (KEFLEX) 500 MG capsule  4 times daily        03/13/21 1506          An After Visit Summary was printed and given to the patient.    Nestor Lewandowsky 03/13/21 1506    Hayden Rasmussen, MD 03/13/21 [phone]

## 2021-03-13 NOTE — ED Triage Notes (Signed)
Pt arrives ambulatory to ED with reports of being about [redacted] weeks pregnant states that she woke up this morning with vaginal bleeding a cramp, pt reports bleeding was heavier than spotting. Has not seen OB yet. 2nd pregnancy, no complications with first.

## 2021-03-13 NOTE — ED Notes (Signed)
Patient transported to Ultrasound 

## 2021-03-13 NOTE — Discharge Instructions (Signed)
Your ultrasound was reassuring that you have an intrauterine pregnancy at 5 weeks 5 days along.  Please follow-up for repeat ultrasound in 2 weeks at your OB/GYN.  Additionally, you have a urinary tract infection.  I have written you a prescription for antibiotics for this.  Please take entire prescription as prescribed.  Return if development of any new or worsening symptoms.

## 2021-03-21 ENCOUNTER — Other Ambulatory Visit (HOSPITAL_BASED_OUTPATIENT_CLINIC_OR_DEPARTMENT_OTHER): Payer: Self-pay

## 2021-03-26 NOTE — L&D Delivery Note (Signed)
Vaginal Delivery Note  Patient pushed for less than 10 minutes after she was noted to be C/C/+2. Guided pushing with maternal urge and regular contractions. At 12:45 PM a viable and healthy female was delivered over intact perineum via   Spontaneous vaginal delivery (Presentation: Right Occiput Anterior). APGAR 8,9. Weight pending. After head was delivered through a loose nuchal cord, shoulders and body easily delivered. Baby laid on maternal abdomen, bulb suction, drying and tactile stimulation performed. Baby noted to have a vigorous cry and moving all four extremities.  Delayed cord clamping done and cord cut by father. Cord blood obtained.   Placenta spontaneously delivered intact with trailing membranes. Uterine atony alleviated by IV pitocin and massage.  Second degree laceration repaired in routine fashion with 2-0 vicryl and 3-0 chromic and small right labial laceration was repaired with one interrupted suture of 3-0 chromic.   Patient tolerated delivery well, there were no complications.    Delivery Details: Delivery Type: NSVD  Anesthesia Epidural [254]   Episiotomy: None [1]    Lacerations: 2nd degree [3];Perineal [11];Labial [10]    Repair suture: 2-0 Vicryl CT-1 and 3-0 Chromic SH  Blood loss (ml): 203   Birth information: Date of birth:  [birth year]  Time of birth:  12:45 PM  Sex:   Female  Name:  "Karter"  APGAR APGAR (1 MIN): 8   APGAR (5 MINS): 9    Weight    Resuscitation:     Drying, stimulation, bulb suction  Cord information: 3 vessel cord    Complications:     None   Placenta: Delivered: Spontaneous, intact  appearance: Normal    Disposition: Mom to postpartum.  Baby to Couplet care / Skin to Skin.  Essie Hart MD 11/01/2021, 1:22 PM

## 2021-03-29 ENCOUNTER — Emergency Department (HOSPITAL_BASED_OUTPATIENT_CLINIC_OR_DEPARTMENT_OTHER)
Admission: EM | Admit: 2021-03-29 | Discharge: 2021-03-29 | Disposition: A | Discharge status: Home / Self Care | Payer: Federal, State, Local not specified - PPO | Attending: Emergency Medicine | Admitting: Emergency Medicine

## 2021-03-29 ENCOUNTER — Other Ambulatory Visit: Payer: Self-pay

## 2021-03-29 DIAGNOSIS — M79631 Pain in right forearm: Secondary | ICD-10-CM | POA: Insufficient documentation

## 2021-03-29 DIAGNOSIS — Z5321 Procedure and treatment not carried out due to patient leaving prior to being seen by health care provider: Secondary | ICD-10-CM | POA: Insufficient documentation

## 2021-04-06 ENCOUNTER — Encounter (HOSPITAL_BASED_OUTPATIENT_CLINIC_OR_DEPARTMENT_OTHER): Payer: Self-pay | Admitting: Emergency Medicine

## 2021-04-06 ENCOUNTER — Emergency Department (HOSPITAL_BASED_OUTPATIENT_CLINIC_OR_DEPARTMENT_OTHER)
Admission: EM | Admit: 2021-04-06 | Discharge: 2021-04-06 | Disposition: A | Discharge status: Home / Self Care | Payer: Federal, State, Local not specified - PPO | Attending: Emergency Medicine | Admitting: Emergency Medicine

## 2021-04-06 ENCOUNTER — Other Ambulatory Visit: Payer: Self-pay

## 2021-04-06 DIAGNOSIS — E876 Hypokalemia: Secondary | ICD-10-CM | POA: Diagnosis not present

## 2021-04-06 DIAGNOSIS — O219 Vomiting of pregnancy, unspecified: Secondary | ICD-10-CM | POA: Diagnosis present

## 2021-04-06 DIAGNOSIS — Z3A09 9 weeks gestation of pregnancy: Secondary | ICD-10-CM | POA: Insufficient documentation

## 2021-04-06 DIAGNOSIS — Z91040 Latex allergy status: Secondary | ICD-10-CM | POA: Diagnosis not present

## 2021-04-06 DIAGNOSIS — O210 Mild hyperemesis gravidarum: Secondary | ICD-10-CM

## 2021-04-06 LAB — BASIC METABOLIC PANEL
Anion gap: 8 (ref 5–15)
BUN: 14 mg/dL (ref 6–20)
CO2: 23 mmol/L (ref 22–32)
Calcium: 9 mg/dL (ref 8.9–10.3)
Chloride: 104 mmol/L (ref 98–111)
Creatinine, Ser: 0.64 mg/dL (ref 0.44–1.00)
GFR, Estimated: >60 mL/min (ref >60)
Glucose, Bld: 100 mg/dL — ABNORMAL HIGH (ref 70–99)
Potassium: 3.3 mmol/L — ABNORMAL LOW (ref 3.5–5.1)
Sodium: 135 mmol/L (ref 135–145)

## 2021-04-06 LAB — CBC WITH DIFFERENTIAL/PLATELET
Abs Immature Granulocytes: 0.03 10*3/uL (ref 0.00–0.07)
Basophils Absolute: 0.1 10*3/uL (ref 0.0–0.1)
Basophils Relative: 1 %
Eosinophils Absolute: 0.2 10*3/uL (ref 0.0–0.5)
Eosinophils Relative: 2 %
HCT: 39.4 % (ref 36.0–46.0)
Hemoglobin: 13.5 g/dL (ref 12.0–15.0)
Immature Granulocytes: 0 %
Lymphocytes Relative: 23 %
Lymphs Abs: 1.8 10*3/uL (ref 0.7–4.0)
MCH: 31.7 pg (ref 26.0–34.0)
MCHC: 34.3 g/dL (ref 30.0–36.0)
MCV: 92.5 fL (ref 80.0–100.0)
Monocytes Absolute: 0.6 10*3/uL (ref 0.1–1.0)
Monocytes Relative: 7 %
Neutro Abs: 5.3 10*3/uL (ref 1.7–7.7)
Neutrophils Relative %: 67 %
Platelets: 262 10*3/uL (ref 150–400)
RBC: 4.26 MIL/uL (ref 3.87–5.11)
RDW: 12.8 % (ref 11.5–15.5)
WBC: 7.9 10*3/uL (ref 4.0–10.5)
nRBC: 0 % (ref 0.0–0.2)

## 2021-04-06 LAB — HCG, QUANTITATIVE, PREGNANCY: hCG, Beta Chain, Quant, S: 171102 m[IU]/mL — ABNORMAL HIGH (ref <5)

## 2021-04-06 MED ORDER — ALUM & MAG HYDROXIDE-SIMETH 200-200-20 MG/5ML PO SUSP
30.0000 mL | Freq: Once | ORAL | Status: AC
Start: 2021-04-06 — End: 2021-04-06
  Administered 2021-04-06: 30 mL via ORAL
  Filled 2021-04-06: qty 30

## 2021-04-06 MED ORDER — ONDANSETRON 8 MG PO TBDP: 8.0000 mg | ORAL_TABLET | Freq: Three times a day (TID) | ORAL | 0 refills | Status: DC | PRN

## 2021-04-06 MED ORDER — SODIUM CHLORIDE 0.9 % IV BOLUS
1000.0000 mL | Freq: Once | INTRAVENOUS | Status: AC
  Administered 2021-04-06: 1000 mL via INTRAVENOUS

## 2021-04-06 MED ORDER — VITAMIN B-6 25 MG PO TABS: 25.0000 mg | ORAL_TABLET | Freq: Every day | ORAL | 0 refills | Status: DC

## 2021-04-06 MED ORDER — ONDANSETRON HCL 4 MG/2ML IJ SOLN
4.0000 mg | Freq: Once | INTRAMUSCULAR | Status: AC
  Administered 2021-04-06: 4 mg via INTRAVENOUS
  Filled 2021-04-06: qty 2

## 2021-04-06 MED ORDER — PYRIDOXINE HCL 100 MG/ML IJ SOLN
100.0000 mg | Freq: Once | INTRAMUSCULAR | Status: AC
Start: 2021-04-06 — End: 2021-04-06
  Administered 2021-04-06: 100 mg via INTRAVENOUS
  Filled 2021-04-06: qty 1

## 2021-04-06 NOTE — ED Provider Notes (Signed)
[facility] EMERGENCY DEPARTMENT Provider Note   CSN: [id number] Arrival date & time: 04/06/21  1033     History  Chief Complaint  Patient presents with   Emesis During Pregnancy    Kimberly Callahan is a 26 y.o. female.  HPI  This is a 26 year old female G2P1 presenting with emesis.  Believes she is roughly [redacted] weeks pregnant, last menstrual cycle mid November.  She has been unable to keep any food down for about a week, vomiting almost every hour.  She denies any abdominal pain or vaginal bleeding, did have vaginal bleeding about 1 week ago but has not had any since then.  She has not seen OBGYN yet, has an appointment next week.  She is currently taking a prenatal vitamin.  Has not tried anything for the nausea and vomiting at home, food is a provoking factor.  PMH: She is G2P1 with previous pregnancy without complication.  No prior abdominal surgeries.    Past Medical History:  Diagnosis Date   Allergy      Home Medications Prior to Admission medications   Medication Sig Start Date End Date Taking? Authorizing Provider  ondansetron (ZOFRAN-ODT) 8 MG disintegrating tablet Take 1 tablet (8 mg total) by mouth every 8 (eight) hours as needed for nausea or vomiting. 04/06/21  Yes Theron Arista, PA-C  vitamin B-6 (PYRIDOXINE) 25 MG tablet Take 1 tablet (25 mg total) by mouth daily. 04/06/21  Yes Theron Arista, PA-C  rizatriptan (MAXALT-MLT) 10 MG disintegrating tablet Take 1 tablet earliest onset of headache.  May repeat in 2 hours if needed.  Maximum 2 tablets in 24 hours 02/06/21   Drema Dallas, DO  topiramate (TOPAMAX) 50 MG tablet Take 1/2 tablet at bedtime for one week, then increase to 1 tablet at bedtime 02/06/21   Drema Dallas, DO      Allergies    Latex, Penicillins, and Augmentin [amoxicillin-pot clavulanate]    Review of Systems   Review of Systems  Constitutional:  Negative for fever.  Gastrointestinal:  Positive for nausea and vomiting. Negative for abdominal  pain.  Genitourinary:  Negative for vaginal bleeding.   Physical Exam Updated Vital Signs BP 107/69 (BP Location: Left Arm)    Pulse 88    Temp 98.3 F (36.8 C) (Oral)    Resp 18    Ht 5\' 4"  (1.626 m)    Wt 63.5 kg    LMP 02/05/2021    SpO2 100%    BMI 24.03 kg/m  Physical Exam Vitals and nursing note reviewed. Exam conducted with a chaperone present.  Constitutional:      Appearance: Normal appearance.  HENT:     Head: Normocephalic and atraumatic.  Eyes:     General: No scleral icterus.       Right eye: No discharge.        Left eye: No discharge.     Extraocular Movements: Extraocular movements intact.     Pupils: Pupils are equal, round, and reactive to light.  Cardiovascular:     Rate and Rhythm: Normal rate and regular rhythm.     Pulses: Normal pulses.     Heart sounds: Normal heart sounds. No murmur heard.   No friction rub. No gallop.  Pulmonary:     Effort: Pulmonary effort is normal. No respiratory distress.     Breath sounds: Normal breath sounds.  Abdominal:     General: Abdomen is flat. Bowel sounds are normal. There is no distension.  Palpations: Abdomen is soft.     Tenderness: There is no abdominal tenderness.     Comments: Abdomen is soft, no tenderness, rigidity or guarding  Skin:    General: Skin is warm and dry.     Coloration: Skin is not jaundiced.  Neurological:     Mental Status: She is alert. Mental status is at baseline.     Coordination: Coordination normal.   ED Results / Procedures / Treatments   Labs (all labs ordered are listed, but only abnormal results are displayed) Labs Reviewed  HCG, QUANTITATIVE, PREGNANCY - Abnormal; Notable for the following components:      Result Value   hCG, Beta Chain, Quant, S 171,102 (*)    All other components within normal limits  BASIC METABOLIC PANEL - Abnormal; Notable for the following components:   Potassium 3.3 (*)    Glucose, Bld 100 (*)    All other components within normal limits  CBC WITH  DIFFERENTIAL/PLATELET    EKG None  Radiology No results found.  Procedures Procedures    Medications Ordered in ED Medications  pyridOXINE (B-6) injection 100 mg (100 mg Intravenous Given 04/06/21 1115)  sodium chloride 0.9 % bolus 1,000 mL (0 mLs Intravenous Stopped 04/06/21 1222)  ondansetron (ZOFRAN) injection 4 mg (4 mg Intravenous Given 04/06/21 1223)  sodium chloride 0.9 % bolus 1,000 mL (0 mLs Intravenous Stopped 04/06/21 1317)  alum & mag hydroxide-simeth (MAALOX/MYLANTA) 200-200-20 MG/5ML suspension 30 mL (30 mLs Oral Given 04/06/21 1336)    ED Course/ Medical Decision Making/ A&P                           Medical Decision Making  This is a 26 year old female presenting due to emesis during pregnancy.  Her vitals are stable, she has nontoxic-appearing.  She is not hypotensive or tachycardic concerning for significant volume depletion.  Abdomen is soft, no vaginal bleeding.  I doubt this is an ectopic or threatened abortion based on history and physical exam.  Additionally doubt any acute abdominal process given there is no peritoneal signs.  We will start with fluids and basic labs to check for signs of electrolyte derangement and hypokalemia specifically.  Additional history obtained: -External records from outside source obtained and reviewed including: Chart review including previous notes, labs, imaging, consultation notes. -Patient was seen 03/13/2021 in the emergency department for vaginal bleeding and lower abdominal pain.  ED note from that time was reviewed, ultrasound ordered which showed: US reveals patient is 5 weeks 5 days pregnant by size, no fetal pole or cardiac activity visualized, however this could be due to early pregnancy  Lab Tests: -I ordered, reviewed, and interpreted labs.  The pertinent results include:  CBC without any evidence of anemia or leukocytosis. BMP: Slight hypokalemia at 3.3, not clinically significant.   HCG (quant): appropriate for fetal  age    Medicines ordered and prescription drug management: I ordered medication including: -NS  for dehydration.  -B6 for Nausea/vomiting.   Reevaluation of the patient after these medicines showed that the patient stayed the same -Will give second liter and zofran.  I discussed with her the association of increased risk for oral clefts with Zofran during the first trimester. Risk small, patient agreeable to proceed.  -I have reviewed the patients home medicines and have made adjustments as needed   Reevaluation: After the interventions noted above, I reevaluated the patient and found that they have :resolved  Patient nausea  and vomiting completely resolved after the Zofran.  She passed p.o. challenge, appropriate for discharge at this point.  Return precautions discussed, Zofran prescribed outpatient as well as B6.  Patient has OB follow-up next week.  Dispostion: Patient discharged in stable condition.  Advised to continue prenatal vitamin, antiemetics prescribed.  Return precautions discussed and agreed on         Final Clinical Impression(s) / ED Diagnoses Final diagnoses:  Hyperemesis gravidarum    Rx / DC Orders ED Discharge Orders          Ordered    vitamin B-6 (PYRIDOXINE) 25 MG tablet  Daily        04/06/21 1406    ondansetron (ZOFRAN-ODT) 8 MG disintegrating tablet  Every 8 hours PRN        04/06/21 1406              Theron Arista, PA-C 04/06/21 1817    Virgina Norfolk, DO 04/07/21 0805

## 2021-04-06 NOTE — ED Triage Notes (Signed)
Pt states she is having N/V for 6 weeks but for the last 2 weeks it has really worsened.  Pt is approx [redacted] weeks pregnant.  Sees ob this month for 1st visit. Pt has hyperemesis with last pregnancy 2020

## 2021-04-06 NOTE — Discharge Instructions (Addendum)
Take the nausea medicine as needed for nausea and vomiting. Follow-up with your OBG next week as planned. Continue taking the prenatal vitamin, drink plenty of fluids at home. Return if you are unable to keep any fluid or food down.

## 2021-04-06 NOTE — ED Notes (Signed)
Pt drank a whole cup of water, says she no longer feels nauseous and is actually craving a meal. Crackers also given.

## 2021-04-28 LAB — OB RESULTS CONSOLE HIV ANTIBODY (ROUTINE TESTING): HIV: NONREACTIVE

## 2021-04-28 LAB — OB RESULTS CONSOLE ABO/RH: RH Type: POSITIVE

## 2021-04-28 LAB — OB RESULTS CONSOLE RUBELLA ANTIBODY, IGM: Rubella: IMMUNE

## 2021-04-28 LAB — OB RESULTS CONSOLE RPR: RPR: NONREACTIVE

## 2021-04-28 LAB — OB RESULTS CONSOLE HEPATITIS B SURFACE ANTIGEN: Hepatitis B Surface Ag: NEGATIVE

## 2021-04-28 LAB — OB RESULTS CONSOLE ANTIBODY SCREEN: Antibody Screen: NEGATIVE

## 2021-04-28 LAB — HEPATITIS C ANTIBODY: HCV Ab: NEGATIVE

## 2021-07-07 LAB — OB RESULTS CONSOLE GC/CHLAMYDIA: Chlamydia: POSITIVE

## 2021-08-19 LAB — OB RESULTS CONSOLE HIV ANTIBODY (ROUTINE TESTING): HIV: NONREACTIVE

## 2021-08-19 LAB — OB RESULTS CONSOLE RPR: RPR: NONREACTIVE

## 2021-08-31 LAB — OB RESULTS CONSOLE GC/CHLAMYDIA: Chlamydia: NEGATIVE

## 2021-09-13 NOTE — Progress Notes (Deleted)
NEUROLOGY FOLLOW UP OFFICE NOTE  BLOSSOM CRUME [id number]  Assessment/Plan:   Migraine without aura, without status migrainosus, intractable vs cluster headache, intractable - autonomic features suggestive of cluster however headache and symptoms are bilateral and autonomic symptoms may be seen in migraine as well   Migraine preventative:  *** Migraine rescue:  rizatriptan 10mg  and Zofran 10mg .   Limit use of pain relievers to no more than 2 days out of week to prevent risk of rebound or medication-overuse headache. Keep headache diary Follow up 6 months.       Subjective:  Cydni I. Kahler is a 26 year old female who follows up for headaches.  UPDATE: Started topiramate in November.  *** Intensity:  *** Duration:  *** Frequency:  *** Frequency of abortive medication: *** Current NSAIDS/analgesics:  none Current triptans:  rizatriptan 10mg  Current ergotamine:  none Current anti-emetic:  Zofran 4mg  Current muscle relaxants:  none Current Antihypertensive medications:  none Current Antidepressant medications:  none Current Anticonvulsant medications:  topiramate 50mg  at bedtime Current anti-CGRP:  none Current Vitamins/Herbal/Supplements:  none Current Antihistamines/Decongestants:  none Other therapy:  none Hormone/birth control:  none  Caffeine:  no  Depression:  no; Anxiety:  no Other pain:  no Sleep hygiene:  trouble falling asleep -mind races  HISTORY:  Onset:  25 years old Location:  starts left temporal radiating to bilateral retro-orbital and band-like Quality:  pounding, pressure Intensity:  8/10 Aura:  absent Prodrome:  absent Associated symptoms:  Nausea, photophobia, dizziness, bilateral conjunctival injections, bilateral eye lacrimation, rhinorrhea, rarely vomiting.  She denies associated visual changes, or unilateral numbness or weakness. Duration:  2 1/2 to 3 days Frequency:  over past year 1-2 times a week Frequency of abortive medication: Tylenol  or Excedrin 2-3 days a week Triggers:  Nexplanon, pregnancy Relieving factors:  Sleep Activity:  movement aggravates   She presented to the ED on 02/02/2021 due to experiencing change in migraine, presenting with right-sided tinnitus.  CT head was normal.  She was treated with a headache cocktail.    Past NSAIDS/analgesics:  Midol, Excedrin, Tylenol Past abortive triptans:  none Past abortive ergotamine:  none Past muscle relaxants:  none Past anti-emetic:  none Past antihypertensive medications:  none Past antidepressant medications:  none Past anticonvulsant medications:  none Past anti-CGRP:  none Past vitamins/Herbal/Supplements:  none Past antihistamines/decongestants:  none Other past therapies:  none    Family history of headache:  no  PAST MEDICAL HISTORY: Past Medical History:  Diagnosis Date   Allergy     MEDICATIONS: Current Outpatient Medications on File Prior to Visit  Medication Sig Dispense Refill   ondansetron (ZOFRAN-ODT) 8 MG disintegrating tablet Take 1 tablet (8 mg total) by mouth every 8 (eight) hours as needed for nausea or vomiting. 20 tablet 0   rizatriptan (MAXALT-MLT) 10 MG disintegrating tablet Take 1 tablet earliest onset of headache.  May repeat in 2 hours if needed.  Maximum 2 tablets in 24 hours 10 tablet 5   topiramate (TOPAMAX) 50 MG tablet Take 1/2 tablet at bedtime for one week, then increase to 1 tablet at bedtime 45 tablet 0   vitamin B-6 (PYRIDOXINE) 25 MG tablet Take 1 tablet (25 mg total) by mouth daily. 30 tablet 0   No current facility-administered medications on file prior to visit.    ALLERGIES: Allergies  Allergen Reactions   Latex Rash and Hives   Penicillins Hives   Augmentin [Amoxicillin-Pot Clavulanate] Hives    FAMILY HISTORY: Family History  Problem Relation Age of Onset   Hypertension Mother       Objective:  *** General: No acute distress.  Patient appears ***-groomed.   Head:   Normocephalic/atraumatic Eyes:  Fundi examined but not visualized Neck: supple, no paraspinal tenderness, full range of motion Heart:  Regular rate and rhythm Lungs:  Clear to auscultation bilaterally Back: No paraspinal tenderness Neurological Exam: alert and oriented to person, place, and time.  Speech fluent and not dysarthric, language intact.  CN II-XII intact. Bulk and tone normal, muscle strength 5/5 throughout.  Sensation to light touch intact.  Deep tendon reflexes 2+ throughout, toes downgoing.  Finger to nose testing intact.  Gait normal, Romberg negative.   Shon Millet, DO  CC: ***

## 2021-09-14 ENCOUNTER — Ambulatory Visit: Payer: Federal, State, Local not specified - PPO | Admitting: Neurology

## 2021-09-14 ENCOUNTER — Encounter: Payer: Self-pay | Admitting: Neurology

## 2021-09-14 DIAGNOSIS — Z029 Encounter for administrative examinations, unspecified: Secondary | ICD-10-CM

## 2021-09-18 ENCOUNTER — Encounter (HOSPITAL_BASED_OUTPATIENT_CLINIC_OR_DEPARTMENT_OTHER): Payer: Self-pay | Admitting: Emergency Medicine

## 2021-09-18 ENCOUNTER — Emergency Department (HOSPITAL_BASED_OUTPATIENT_CLINIC_OR_DEPARTMENT_OTHER)
Admission: EM | Admit: 2021-09-18 | Discharge: 2021-09-18 | Disposition: A | Discharge status: Home / Self Care | Payer: Federal, State, Local not specified - PPO | Attending: Emergency Medicine | Admitting: Emergency Medicine

## 2021-09-18 DIAGNOSIS — Z3A00 Weeks of gestation of pregnancy not specified: Secondary | ICD-10-CM | POA: Insufficient documentation

## 2021-09-18 DIAGNOSIS — E860 Dehydration: Secondary | ICD-10-CM | POA: Insufficient documentation

## 2021-09-18 DIAGNOSIS — E876 Hypokalemia: Secondary | ICD-10-CM | POA: Insufficient documentation

## 2021-09-18 DIAGNOSIS — Z91040 Latex allergy status: Secondary | ICD-10-CM | POA: Insufficient documentation

## 2021-09-18 DIAGNOSIS — O26893 Other specified pregnancy related conditions, third trimester: Secondary | ICD-10-CM | POA: Diagnosis present

## 2021-09-18 DIAGNOSIS — Z3493 Encounter for supervision of normal pregnancy, unspecified, third trimester: Secondary | ICD-10-CM

## 2021-09-18 LAB — CBC
HCT: 33.9 % — ABNORMAL LOW (ref 36.0–46.0)
Hemoglobin: 11.7 g/dL — ABNORMAL LOW (ref 12.0–15.0)
MCH: 31.6 pg (ref 26.0–34.0)
MCHC: 34.5 g/dL (ref 30.0–36.0)
MCV: 91.6 fL (ref 80.0–100.0)
Platelets: 159 10*3/uL (ref 150–400)
RBC: 3.7 MIL/uL — ABNORMAL LOW (ref 3.87–5.11)
RDW: 12.7 % (ref 11.5–15.5)
WBC: 10.4 10*3/uL (ref 4.0–10.5)
nRBC: 0 % (ref 0.0–0.2)

## 2021-09-18 LAB — HEPATIC FUNCTION PANEL
ALT: 14 U/L (ref 0–44)
AST: 31 U/L (ref 15–41)
Albumin: 2.9 g/dL — ABNORMAL LOW (ref 3.5–5.0)
Alkaline Phosphatase: 134 U/L — ABNORMAL HIGH (ref 38–126)
Bilirubin, Direct: 0.1 mg/dL (ref 0.0–0.2)
Indirect Bilirubin: 1 mg/dL — ABNORMAL HIGH (ref 0.3–0.9)
Total Bilirubin: 1.1 mg/dL (ref 0.3–1.2)
Total Protein: 6.7 g/dL (ref 6.5–8.1)

## 2021-09-18 LAB — URINALYSIS, ROUTINE W REFLEX MICROSCOPIC
Bilirubin Urine: NEGATIVE
Glucose, UA: 100 mg/dL — AB
Hgb urine dipstick: NEGATIVE
Ketones, ur: >=80 mg/dL — AB
Nitrite: NEGATIVE
Protein, ur: NEGATIVE mg/dL
Specific Gravity, Urine: 1.025 (ref 1.005–1.030)
pH: 5.5 (ref 5.0–8.0)

## 2021-09-18 LAB — BASIC METABOLIC PANEL
Anion gap: 11 (ref 5–15)
BUN: 13 mg/dL (ref 6–20)
CO2: 16 mmol/L — ABNORMAL LOW (ref 22–32)
Calcium: 8.5 mg/dL — ABNORMAL LOW (ref 8.9–10.3)
Chloride: 105 mmol/L (ref 98–111)
Creatinine, Ser: 0.73 mg/dL (ref 0.44–1.00)
GFR, Estimated: >60 mL/min (ref >60)
Glucose, Bld: 193 mg/dL — ABNORMAL HIGH (ref 70–99)
Potassium: 3.1 mmol/L — ABNORMAL LOW (ref 3.5–5.1)
Sodium: 132 mmol/L — ABNORMAL LOW (ref 135–145)

## 2021-09-18 LAB — URINALYSIS, MICROSCOPIC (REFLEX): RBC / HPF: NONE SEEN RBC/hpf (ref 0–5)

## 2021-09-18 LAB — PREGNANCY, URINE: Preg Test, Ur: POSITIVE — AB

## 2021-09-18 LAB — CBG MONITORING, ED
Glucose-Capillary: 183 mg/dL — ABNORMAL HIGH (ref 70–99)
Glucose-Capillary: 99 mg/dL (ref 70–99)

## 2021-09-18 MED ORDER — LACTATED RINGERS IV BOLUS
1000.0000 mL | Freq: Once | INTRAVENOUS | Status: AC
  Administered 2021-09-18: 1000 mL via INTRAVENOUS

## 2021-09-18 MED ORDER — POTASSIUM CHLORIDE 10 MEQ/100ML IV SOLN
10.0000 meq | Freq: Once | INTRAVENOUS | Status: AC
  Administered 2021-09-18: 10 meq via INTRAVENOUS
  Filled 2021-09-18: qty 100

## 2021-09-18 NOTE — ED Notes (Signed)
Checked CBG 99, RN informed

## 2021-09-18 NOTE — ED Triage Notes (Addendum)
Pt reports lightheadedness and dizziness since this morning. States when she stands up, sx worsen and she "sees white spots". Pt is 8 months pregnant. Endorse "white milky watery" discharge x 2 weeks. Also endorses abdominal cramping this morning, but that has subsided.   Prenatal care through [facility]. Plans to deliver at [facility].

## 2021-09-18 NOTE — Progress Notes (Signed)
Spoke with Dr. Connye Burkitt. Pt is a G2P1 at 16 1/[redacted] weeks gestation presenting with c/o dizziness and abd cramping earlier in the day, that has now resolved. No vaginal bleeding or leaking of fluid. Blood pressures are normal. They have given the pt a liter of LR and have drawn the following labs:CBC, CMP, Hepatic function panel and they have obtained a U/A.

## 2021-09-18 NOTE — Progress Notes (Signed)
Received call from [facility] RN, Alejandro Mulling. Pt is a G2P1 at 63 1/[redacted] weeks gestation presenting with c/o dizziness and abd cramping earlier today that has resolved. Pt CBG is 183 and the pt denies any diabetes or problems with previous pregnancy. No vaginal bleeding or leaking of fluid.She had a vaginal delivery with her first baby. She is a pt of [address] [facility].

## 2021-09-18 NOTE — Progress Notes (Signed)
Report given to the 7P-7A Joycie Peek RN.

## 2021-09-19 LAB — OB RESULTS CONSOLE GC/CHLAMYDIA: Neisseria Gonorrhea: NEGATIVE

## 2021-09-20 LAB — URINE CULTURE

## 2021-09-21 LAB — OB RESULTS CONSOLE GC/CHLAMYDIA: Neisseria Gonorrhea: NEGATIVE

## 2021-10-16 LAB — OB RESULTS CONSOLE GBS: GBS: POSITIVE

## 2021-11-01 ENCOUNTER — Inpatient Hospital Stay (HOSPITAL_COMMUNITY): Payer: Federal, State, Local not specified - PPO | Admitting: Anesthesiology

## 2021-11-01 ENCOUNTER — Inpatient Hospital Stay (HOSPITAL_COMMUNITY)
Admission: AD | Admit: 2021-11-01 | Discharge: 2021-11-03 | DRG: 807 | Disposition: A | Discharge status: Home / Self Care | Payer: Federal, State, Local not specified - PPO | Attending: Obstetrics & Gynecology | Admitting: Obstetrics & Gynecology

## 2021-11-01 ENCOUNTER — Encounter (HOSPITAL_COMMUNITY): Payer: Self-pay

## 2021-11-01 DIAGNOSIS — Z880 Allergy status to penicillin: Secondary | ICD-10-CM

## 2021-11-01 DIAGNOSIS — O4202 Full-term premature rupture of membranes, onset of labor within 24 hours of rupture: Secondary | ICD-10-CM | POA: Diagnosis not present

## 2021-11-01 DIAGNOSIS — Z91040 Latex allergy status: Secondary | ICD-10-CM | POA: Diagnosis not present

## 2021-11-01 DIAGNOSIS — Z3A38 38 weeks gestation of pregnancy: Secondary | ICD-10-CM | POA: Diagnosis not present

## 2021-11-01 DIAGNOSIS — O26893 Other specified pregnancy related conditions, third trimester: Secondary | ICD-10-CM | POA: Diagnosis present

## 2021-11-01 DIAGNOSIS — O99824 Streptococcus B carrier state complicating childbirth: Secondary | ICD-10-CM | POA: Diagnosis present

## 2021-11-01 LAB — RPR: RPR Ser Ql: NONREACTIVE

## 2021-11-01 LAB — CBC
HCT: 37.4 % (ref 36.0–46.0)
Hemoglobin: 12.4 g/dL (ref 12.0–15.0)
MCH: 30.5 pg (ref 26.0–34.0)
MCHC: 33.2 g/dL (ref 30.0–36.0)
MCV: 91.9 fL (ref 80.0–100.0)
Platelets: 168 10*3/uL (ref 150–400)
RBC: 4.07 MIL/uL (ref 3.87–5.11)
RDW: 13 % (ref 11.5–15.5)
WBC: 11.7 10*3/uL — ABNORMAL HIGH (ref 4.0–10.5)
nRBC: 0 % (ref 0.0–0.2)

## 2021-11-01 LAB — TYPE AND SCREEN
ABO/RH(D): A POS
Antibody Screen: NEGATIVE

## 2021-11-01 MED ORDER — ZOLPIDEM TARTRATE 5 MG PO TABS: 5.0000 mg | ORAL_TABLET | Freq: Every evening | ORAL | Status: DC | PRN

## 2021-11-01 MED ORDER — COCONUT OIL OIL: 1.0000 | TOPICAL_OIL | Status: DC | PRN

## 2021-11-01 MED ORDER — EPHEDRINE 5 MG/ML INJ
10.0000 mg | INTRAVENOUS | Status: DC | PRN
Start: 2021-11-01 — End: 2021-11-01

## 2021-11-01 MED ORDER — IBUPROFEN 600 MG PO TABS
600.0000 mg | ORAL_TABLET | Freq: Four times a day (QID) | ORAL | Status: DC
  Administered 2021-11-01 – 2021-11-03 (×7): 600 mg via ORAL
  Filled 2021-11-01 (×8): qty 1

## 2021-11-01 MED ORDER — SOD CITRATE-CITRIC ACID 500-334 MG/5ML PO SOLN: 30.0000 mL | ORAL | Status: DC | PRN

## 2021-11-01 MED ORDER — OXYTOCIN-SODIUM CHLORIDE 30-0.9 UT/500ML-% IV SOLN
2.5000 [IU]/h | INTRAVENOUS | Status: DC
  Administered 2021-11-01: 2.5 [IU]/h via INTRAVENOUS
  Filled 2021-11-01: qty 500

## 2021-11-01 MED ORDER — OXYCODONE-ACETAMINOPHEN 5-325 MG PO TABS: 2.0000 | ORAL_TABLET | ORAL | Status: DC | PRN

## 2021-11-01 MED ORDER — BENZOCAINE-MENTHOL 20-0.5 % EX AERO
1.0000 | INHALATION_SPRAY | CUTANEOUS | Status: DC | PRN
  Administered 2021-11-01: 1 via TOPICAL
  Filled 2021-11-01: qty 56

## 2021-11-01 MED ORDER — LACTATED RINGERS IV SOLN
500.0000 mL | Freq: Once | INTRAVENOUS | Status: AC
Start: 2021-11-01 — End: 2021-11-01
  Administered 2021-11-01: 500 mL via INTRAVENOUS

## 2021-11-01 MED ORDER — EPHEDRINE 5 MG/ML INJ: 10.0000 mg | INTRAVENOUS | Status: DC | PRN

## 2021-11-01 MED ORDER — ACETAMINOPHEN 325 MG PO TABS: 650.0000 mg | ORAL_TABLET | ORAL | Status: DC | PRN

## 2021-11-01 MED ORDER — LACTATED RINGERS IV SOLN: 500.0000 mL | INTRAVENOUS | Status: DC | PRN

## 2021-11-01 MED ORDER — ONDANSETRON HCL 4 MG/2ML IJ SOLN: 4.0000 mg | INTRAMUSCULAR | Status: DC | PRN

## 2021-11-01 MED ORDER — DIPHENHYDRAMINE HCL 50 MG/ML IJ SOLN
12.5000 mg | INTRAMUSCULAR | Status: DC | PRN
  Filled 2021-11-01: qty 1

## 2021-11-01 MED ORDER — OXYTOCIN-SODIUM CHLORIDE 30-0.9 UT/500ML-% IV SOLN: 2.5000 [IU]/h | INTRAVENOUS | Status: DC | PRN

## 2021-11-01 MED ORDER — WITCH HAZEL-GLYCERIN EX PADS
1.0000 | MEDICATED_PAD | CUTANEOUS | Status: DC | PRN
  Administered 2021-11-01: 1 via TOPICAL

## 2021-11-01 MED ORDER — ONDANSETRON HCL 4 MG PO TABS: 4.0000 mg | ORAL_TABLET | ORAL | Status: DC | PRN

## 2021-11-01 MED ORDER — PHENYLEPHRINE 80 MCG/ML (10ML) SYRINGE FOR IV PUSH (FOR BLOOD PRESSURE SUPPORT)
PREFILLED_SYRINGE | INTRAVENOUS | Status: AC
  Filled 2021-11-01: qty 10

## 2021-11-01 MED ORDER — DIPHENHYDRAMINE HCL 50 MG/ML IJ SOLN
25.0000 mg | Freq: Once | INTRAMUSCULAR | Status: AC
Start: 2021-11-01 — End: 2021-11-01
  Administered 2021-11-01: 25 mg via INTRAVENOUS

## 2021-11-01 MED ORDER — SIMETHICONE 80 MG PO CHEW: 80.0000 mg | CHEWABLE_TABLET | ORAL | Status: DC | PRN

## 2021-11-01 MED ORDER — DIPHENHYDRAMINE HCL 25 MG PO CAPS: 25.0000 mg | ORAL_CAPSULE | Freq: Four times a day (QID) | ORAL | Status: DC | PRN

## 2021-11-01 MED ORDER — SENNOSIDES-DOCUSATE SODIUM 8.6-50 MG PO TABS
2.0000 | ORAL_TABLET | Freq: Every day | ORAL | Status: DC
  Administered 2021-11-01 – 2021-11-03 (×3): 2 via ORAL
  Filled 2021-11-01 (×3): qty 2

## 2021-11-01 MED ORDER — FENTANYL-BUPIVACAINE-NACL 0.5-0.125-0.9 MG/250ML-% EP SOLN
12.0000 mL/h | EPIDURAL | Status: DC | PRN
  Administered 2021-11-01: 12 mL/h via EPIDURAL

## 2021-11-01 MED ORDER — LACTATED RINGERS IV SOLN
INTRAVENOUS | Status: DC
Start: 2021-11-01 — End: 2021-11-01

## 2021-11-01 MED ORDER — ACETAMINOPHEN 325 MG PO TABS
650.0000 mg | ORAL_TABLET | ORAL | Status: DC | PRN
Start: 2021-11-01 — End: 2021-11-03

## 2021-11-01 MED ORDER — OXYTOCIN BOLUS FROM INFUSION
333.0000 mL | Freq: Once | INTRAVENOUS | Status: AC
  Administered 2021-11-01: 333 mL via INTRAVENOUS

## 2021-11-01 MED ORDER — PHENYLEPHRINE 80 MCG/ML (10ML) SYRINGE FOR IV PUSH (FOR BLOOD PRESSURE SUPPORT)
80.0000 ug | PREFILLED_SYRINGE | INTRAVENOUS | Status: DC | PRN
Start: 2021-11-01 — End: 2021-11-01
  Administered 2021-11-01: 80 ug via INTRAVENOUS

## 2021-11-01 MED ORDER — LIDOCAINE HCL (PF) 1 % IJ SOLN
30.0000 mL | INTRAMUSCULAR | Status: DC | PRN
Start: 2021-11-01 — End: 2021-11-01

## 2021-11-01 MED ORDER — OXYCODONE-ACETAMINOPHEN 5-325 MG PO TABS: 1.0000 | ORAL_TABLET | ORAL | Status: DC | PRN

## 2021-11-01 MED ORDER — ONDANSETRON HCL 4 MG/2ML IJ SOLN: 4.0000 mg | Freq: Four times a day (QID) | INTRAMUSCULAR | Status: DC | PRN

## 2021-11-01 MED ORDER — OXYCODONE HCL 5 MG PO TABS: 10.0000 mg | ORAL_TABLET | ORAL | Status: DC | PRN

## 2021-11-01 MED ORDER — OXYCODONE HCL 5 MG PO TABS: 5.0000 mg | ORAL_TABLET | ORAL | Status: DC | PRN

## 2021-11-01 MED ORDER — FLEET ENEMA 7-19 GM/118ML RE ENEM: 1.0000 | ENEMA | RECTAL | Status: DC | PRN

## 2021-11-01 MED ORDER — TETANUS-DIPHTH-ACELL PERTUSSIS 5-2.5-18.5 LF-MCG/0.5 IM SUSY: 0.5000 mL | PREFILLED_SYRINGE | Freq: Once | INTRAMUSCULAR | Status: DC

## 2021-11-01 MED ORDER — DIBUCAINE (PERIANAL) 1 % EX OINT: 1.0000 | TOPICAL_OINTMENT | CUTANEOUS | Status: DC | PRN

## 2021-11-01 MED ORDER — PHENYLEPHRINE 80 MCG/ML (10ML) SYRINGE FOR IV PUSH (FOR BLOOD PRESSURE SUPPORT): 80.0000 ug | PREFILLED_SYRINGE | INTRAVENOUS | Status: DC | PRN

## 2021-11-01 MED ORDER — LIDOCAINE HCL (PF) 1 % IJ SOLN
INTRAMUSCULAR | Status: DC | PRN
  Administered 2021-11-01: 4 mL via EPIDURAL
  Administered 2021-11-01: 6 mL via EPIDURAL

## 2021-11-01 MED ORDER — PRENATAL MULTIVITAMIN CH
1.0000 | ORAL_TABLET | Freq: Every day | ORAL | Status: DC
Start: 2021-11-02 — End: 2021-11-03
  Administered 2021-11-01 – 2021-11-03 (×3): 1 via ORAL
  Filled 2021-11-01 (×3): qty 1

## 2021-11-01 MED ORDER — LACTATED RINGERS IV SOLN: INTRAVENOUS | Status: DC

## 2021-11-01 MED ORDER — FENTANYL-BUPIVACAINE-NACL 0.5-0.125-0.9 MG/250ML-% EP SOLN
EPIDURAL | Status: AC
  Filled 2021-11-01: qty 250

## 2021-11-01 MED ORDER — VANCOMYCIN HCL IN DEXTROSE 1-5 GM/200ML-% IV SOLN
1000.0000 mg | Freq: Two times a day (BID) | INTRAVENOUS | Status: DC
  Administered 2021-11-01: 1000 mg via INTRAVENOUS
  Filled 2021-11-01: qty 200

## 2021-11-01 NOTE — Lactation Note (Addendum)
This note was copied from a baby's chart. Lactation Consultation Note  Patient Name: Kimberly Callahan [id number] Date: 11/01/2021 Reason for consult: Initial assessment;Early term 37-38.6wks Age:28 hours P2, ETI female infant less than 6 lbs. Birth Parent latched infant on her left breast, infant latched with depth and sustained latch was still breastfeeding past 12 minutes when LC left the room. Birth Parent will continue to BF infant by cues , 8+ times within 24 hours, STS.  LC discussed ways to keep infant alert while latching at the breast such as: breast compressions, gently stroking infant's neck , shoulder and hands. If infant is sleepy past 3 hours wake infant to BF, if infant is not latching to call RN/LC or hand express and give infant back EBM by spoon. Will limit total feedings to 30 minute or less to reserve infant's energy due being less than 6 lbs at birth. Birth Parent plans to hand express and give infant back EBM by spoon after latching infant at the breast for the first 24 hours with every feeding. Birth Parent knows to call RN/LC if their are any BF questions, concerns or needs further latch assistance. Birth Parent  knows if infant's  blood sugar is low ( it's  currently pending),  Birth Parent will be set up with DEBP, LC informed RN.  Maternal Data Has patient been taught Hand Expression?: Yes Does the patient have breastfeeding experience prior to this delivery?: Yes How long did the patient breastfeed?: Birth Parent , BF 1st child who is 2 years for 6 months.  Feeding Mother's Current Feeding Choice: Breast Milk  LATCH Score Latch: Grasps breast easily, tongue down, lips flanged, rhythmical sucking.  Audible Swallowing: Spontaneous and intermittent  Type of Nipple: Everted at rest and after stimulation  Comfort (Breast/Nipple): Soft / non-tender  Hold (Positioning): Assistance needed to correctly position infant at breast and maintain latch.  LATCH Score:  9   Lactation Tools Discussed/Used    Interventions Interventions: Breast feeding basics reviewed;Assisted with latch;Skin to skin;Hand express;Breast compression;Adjust position;Support pillows;Position options;Expressed milk;Education;LC Services brochure  Discharge Pump: Personal [place] Bowl DEBP at home.)  Consult Status Consult Status: Follow-up Date: 11/02/21 Follow-up type: In-patient    Danelle Earthly 11/01/2021, 6:27 PM

## 2021-11-01 NOTE — Lactation Note (Signed)
This note was copied from a baby's chart. Lactation Consultation Note  Patient Name: Boy Jenafer Winterton [id number] Date: 11/01/2021 Reason for consult: L&D Initial assessment;Early term 37-38.6wks;Breastfeeding assistance (LC L/D @ 45 mins PP. LD RN was assisting w/ latch. LC finished assisting and worked on depth. Swallows noted. Birthing parent exp BF of 7 months.) Age:28 mins - Latch score 8 .  Birthing parent aware she will be seen on [facility] by [facility].  Maternal Data    Feeding Mother's Current Feeding Choice: Breast Milk  LATCH Score Latch: Grasps breast easily, tongue down, lips flanged, rhythmical sucking.  Audible Swallowing: A few with stimulation  Type of Nipple: Everted at rest and after stimulation  Comfort (Breast/Nipple): Soft / non-tender  Hold (Positioning): Assistance needed to correctly position infant at breast and maintain latch.  LATCH Score: 8   Lactation Tools Discussed/Used    Interventions Interventions: Breast feeding basics reviewed;Assisted with latch;Skin to skin;Breast compression;Support pillows;Education  Discharge    Consult Status Consult Status: Follow-up from L&D Date: 11/01/21 Follow-up type: In-patient    Matilde Sprang Adekunle Rohrbach 11/01/2021, 1:38 PM

## 2021-11-01 NOTE — Anesthesia Preprocedure Evaluation (Signed)

## 2021-11-01 NOTE — Progress Notes (Signed)
Spoke to Dr. Mora Appl about borderline High BP's on admission to [facility].  124/92 @1450  120/87 @1602  116/76 @1726   Pt denies HA, vision changes, sharp epigastric pain.  Has voided and pain 0/10.  RN to notify on call MD for any High BP (parameters to call if SBP>160 and/or DBP >110), especially if pt is symptomatic.

## 2021-11-01 NOTE — Anesthesia Procedure Notes (Signed)
Epidural Patient location during procedure: OB Start time: 11/01/2021 10:03 AM End time: 11/01/2021 10:11 AM  Staffing Anesthesiologist: Lucretia Kern, MD Performed: anesthesiologist   Preanesthetic Checklist Completed: patient identified, IV checked, risks and benefits discussed, monitors and equipment checked, pre-op evaluation and timeout performed  Epidural Patient position: sitting Prep: DuraPrep Patient monitoring: heart rate, continuous pulse ox and blood pressure Approach: midline Location: L3-L4 Injection technique: LOR air  Needle:  Needle type: Tuohy  Needle gauge: 17 G Needle length: 9 cm Needle insertion depth: 5 cm Catheter type: closed end flexible Catheter size: 19 Gauge Catheter at skin depth: 10 cm Test dose: negative  Assessment Events: blood not aspirated, injection not painful, no injection resistance, no paresthesia and negative IV test  Additional Notes Reason for block:procedure for pain

## 2021-11-01 NOTE — H&P (Signed)
OB ADMISSION HISTORY & PHYSICAL  Admission Date: 11/01/2021  8:52 AM  Admit Diagnosis: 1. Active labor 2. SROM  Kimberly Callahan is a 26 y.o. female G2P1001 [redacted]w[redacted]d admitted from the MAU for active labor and SROM.  Patient notes that her water broke at 04:45 am this morning. She waited at home until the contractions started  At around 8:00 am.  She endorses active FM, denies vaginal bleeding.   History of current pregnancy: G2P1001   Prenatal Care with: [facility] Patient entered prenatal care at 11 wks.   EDC 11/12/21 by LMP 02/05/21 and congruent w/ 11 wk U/S.   Anatomy scan:  20 wks, complete w/ anterior placenta.    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 Vitamin D deficiency Hyperemesis gravidarum Poor weight gain (weight loss early pregnancy) Chlamydia during pregnancy - early, treated TOC neg Allergy to Latex  Prenatal Labs: ABO, Rh: -A POS Antibody: NEG Rubella: Immune (02/03 0000)  RPR:   NR HBsAg: Negative (02/03 0000)  HIV: Non-reactive (05/27 0000)  1 HR GCT: PASS  GBS:   POS GC/CHL: Negative at 36 wks Genetics: Low risk female  Vaccines: Tdap: declined Flu declined Covid: N  Prenatal Transfer Tool  Maternal Diabetes: No Genetic Screening: Normal Maternal Ultrasounds/Referrals: Normal Fetal Ultrasounds or other Referrals:  None Maternal Substance Abuse:  No Significant Maternal Medications:  Meds include: Other: Diclegis, Vitamin D, PNV Significant Maternal Lab Results:  Group B Strep positive Other Comments:  None  OB History  Gravida Para Term Preterm AB Living  2 1 1     1   SAB IAB Ectopic Multiple Live Births        0 1    # Outcome Date GA Lbr Len/2nd Weight Sex Delivery Anes PTL Lv  2 Current           1 Term 12/01/18 [redacted]w[redacted]d 04:00 / 00:47 3121 g M Vag-Spont EPI  LIV    Medical / Surgical History: Past medical history:  Past Medical History:  Diagnosis Date   Allergy     Past surgical history:  Past  Surgical History:  Procedure Laterality Date   TONSILECTOMY, ADENOIDECTOMY, BILATERAL MYRINGOTOMY AND TUBES  2006-7   Family History:  Family History  Problem Relation Age of Onset   Hypertension Mother     Social History:  reports that she has never smoked. She has never used smokeless tobacco. She reports that she does not drink alcohol and does not use drugs.  Allergies: Latex, Penicillins, and Augmentin [amoxicillin-pot clavulanate]   Current Medications at time of Admission:  Prior to Admission medications   Medication Sig Start Date End Date Taking? Authorizing Provider  prenatal vitamin w/FE, FA (NATACHEW) 29-1 MG CHEW chewable tablet Chew 1 tablet by mouth daily at 12 noon.   Yes [provider]  ondansetron (ZOFRAN-ODT) 8 MG disintegrating tablet Take 1 tablet (8 mg total) by mouth every 8 (eight) hours as needed for nausea or vomiting. 04/06/21   06/04/21, PA-C  rizatriptan (MAXALT-MLT) 10 MG disintegrating tablet Take 1 tablet earliest onset of headache.  May repeat in 2 hours if needed.  Maximum 2 tablets in 24 hours 02/06/21   02/08/21, DO  topiramate (TOPAMAX) 50 MG tablet Take 1/2 tablet at bedtime for one week, then increase to 1 tablet at bedtime 02/06/21   02/08/21, DO  vitamin B-6 (PYRIDOXINE) 25 MG tablet Take 1 tablet (25 mg total) by mouth daily. 04/06/21  Theron Arista, PA-C    Review of Systems: Constitutional: Negative   HENT: Negative   Eyes: Negative   Respiratory: Negative   Cardiovascular: Negative   Gastrointestinal: Negative  Genitourinary: neg for bloody show, POS for LOF   Musculoskeletal: Negative   Skin: Negative   Neurological: Negative   Endo/Heme/Allergies: Negative   Psychiatric/Behavioral: Negative    Physical Exam: VS: Blood pressure 102/69, pulse 89, temperature 98.5 F (36.9 C), temperature source Oral, resp. rate 20, height 5\' 4"  (1.626 m), weight 65.3 kg, last menstrual period 02/05/2021, SpO2 98 %, unknown if  currently breastfeeding. General: AAO x3, having painful contractions GU/GI: Abdomen gravid, non-tender, non-distended, active FM, vertex, EFW 6 # per Leopold's Extremities: No edema, negative for pain, tenderness, and cords  Cervical exam:Dilation: 5 Effacement (%): 100 Station: 0 Exam by:: Dr. [email or web address] Possible forebag FHR: baseline rate 135 / variability moderate / accelerations occasional 10x10 / rare early decelerations TOCO: q 1-2 min  Most recent growth ultrasound 09/15/21: 09/17/21 pregnancy, vertex presentation, anterior placenta Amniotic fluid appears normal  EFW 1716 grams 3# 13 oz 23%      Assessment: 26 y.o. G2P1001 [redacted]w[redacted]d admitted for SROM and active labor  second stage of labor FHR category 1 GBS Pos  Plan:  Admit to Labor and Delivery Routine admission orders Epidural on board IV hydration Continuous monitoring IV Vancomycin for GBS prophylaxis] Anticipate NSVD delivery   [redacted]w[redacted]d MD 11/01/2021 10:41 AM

## 2021-11-01 NOTE — MAU Provider Note (Signed)
Event Date/Time   First Provider Initiated Contact with Patient 11/01/21 [phone]     S: Ms. Kimberly Callahan is a 26 y.o. G2P1001 at [redacted]w[redacted]d  who presents to MAU today complaining contractions q 2 minutes since 0800. She denies vaginal bleeding. She endorses LOF at 0440. She reports normal fetal movement.    O: BP 104/85 (BP Location: Right Arm)   Pulse (!) 102   Resp (!) 23   LMP 02/05/2021  GENERAL: Well-developed, well-nourished female in no acute distress.  HEAD: Normocephalic, atraumatic.  CHEST: Normal effort of breathing, regular heart rate ABDOMEN: Soft, nontender, gravid  Cervical exam:  Dilation: 4 Effacement (%): 100 Station: -3 Presentation: Vertex Exam by:: Antony Odea, CNM  Fetal Monitoring: Baseline: 150 Variability: moderate Accelerations: 15x15 Decelerations: none Contractions: 2-3  A: SIUP at [redacted]w[redacted]d  Active labor SROM  P: -Admit to labor and delivery -RN to call MD for orders  Rolm Bookbinder, CNM 11/01/2021 9:03 AM

## 2021-11-02 LAB — CBC
HCT: 30.5 % — ABNORMAL LOW (ref 36.0–46.0)
Hemoglobin: 10.2 g/dL — ABNORMAL LOW (ref 12.0–15.0)
MCH: 30.8 pg (ref 26.0–34.0)
MCHC: 33.4 g/dL (ref 30.0–36.0)
MCV: 92.1 fL (ref 80.0–100.0)
Platelets: 156 10*3/uL (ref 150–400)
RBC: 3.31 MIL/uL — ABNORMAL LOW (ref 3.87–5.11)
RDW: 12.9 % (ref 11.5–15.5)
WBC: 14 10*3/uL — ABNORMAL HIGH (ref 4.0–10.5)
nRBC: 0 % (ref 0.0–0.2)

## 2021-11-02 MED ORDER — EPINEPHRINE TOPICAL FOR CIRCUMCISION 0.1 MG/ML: 1.0000 [drp] | TOPICAL | Status: DC | PRN

## 2021-11-02 MED ORDER — LIDOCAINE 1% INJECTION FOR CIRCUMCISION
0.8000 mL | INJECTION | Freq: Once | INTRAVENOUS | Status: DC
  Filled 2021-11-02: qty 1

## 2021-11-02 MED ORDER — SUCROSE 24% NICU/PEDS ORAL SOLUTION: 0.5000 mL | OROMUCOSAL | Status: DC | PRN

## 2021-11-02 MED ORDER — GELATIN ABSORBABLE 12-7 MM EX MISC: 1.0000 | Freq: Once | CUTANEOUS | Status: DC | PRN

## 2021-11-02 NOTE — Anesthesia Postprocedure Evaluation (Signed)
Anesthesia Post Note  Patient: Kimberly Callahan  Procedure(s) Performed: AN AD HOC LABOR EPIDURAL     Patient location during evaluation: Mother Baby Anesthesia Type: Epidural Level of consciousness: awake Pain management: satisfactory to patient Vital Signs Assessment: post-procedure vital signs reviewed and stable Respiratory status: spontaneous breathing Cardiovascular status: stable Anesthetic complications: no   No notable events documented.  Last Vitals:  Vitals:   11/02/21 0018 11/02/21 0405  BP: 123/78 118/80  Pulse: 89 94  Resp: 16 17  Temp: 36.7 C 36.8 C  SpO2: 98% 98%    Last Pain:  Vitals:   11/02/21 0720  TempSrc:   PainSc: 0-No pain   Pain Goal:                   [employer]

## 2021-11-02 NOTE — Lactation Note (Signed)
This note was copied from a baby's chart. Lactation Consultation Note  Patient Name: Kimberly Callahan QASTM'H Date: 11/02/2021 Reason for consult: Follow-up assessment;Mother's request;Difficult latch;Early term 37-38.6wks;Breastfeeding assistance Age:28 hours  Infant sleepy since circ, not able to latch as per birth parent. LC fed 4 ml of colostrum on spoon, then assisted with latch with signs of milk transfer.  Infant feeding at the end of the visit.   Maternal Data Has patient been taught Hand Expression?: Yes  Feeding Mother's Current Feeding Choice: Breast Milk  LATCH Score Latch: Repeated attempts needed to sustain latch, nipple held in mouth throughout feeding, stimulation needed to elicit sucking reflex.  Audible Swallowing: Spontaneous and intermittent  Type of Nipple: Everted at rest and after stimulation  Comfort (Breast/Nipple): Soft / non-tender  Hold (Positioning): Assistance needed to correctly position infant at breast and maintain latch.  LATCH Score: 8   Lactation Tools Discussed/Used    Interventions Interventions: Breast feeding basics reviewed;Assisted with latch;Skin to skin;Breast massage;Hand express;Breast compression;Adjust position;Support pillows;Position options;Expressed milk;Education;Visual merchandiser education  Discharge    Consult Status Consult Status: Follow-up Date: 11/03/21 Follow-up type: In-patient    [name]  [name] 11/02/2021, 3:51 PM

## 2021-11-02 NOTE — Progress Notes (Signed)
Post Partum Day 1 Subjective: Patient without complaints today.  She denies any pain, no heavy bleeding.  She is breast feeding, Mother and baby are bonding well.  She denies headache, blurry vision, no RUQ pain.   Objective: Blood pressure 118/80, pulse 94, temperature 98.3 F (36.8 C), temperature source Oral, resp. rate 17, height 5\' 4"  (1.626 m), weight 65.3 kg, last menstrual period 02/05/2021, SpO2 98 %, unknown if currently breastfeeding.  Physical Exam:  General: alert, cooperative, and no distress Lochia: appropriate Uterine Fundus: firm Incision: n/a (perineum not examined) DVT Evaluation: No evidence of DVT seen on physical exam. Negative Homan's sign. No cords or calf tenderness.  Recent Labs    11/01/21 0929 11/02/21 0555  HGB 12.4 10.2*  HCT 37.4 30.5*    Assessment/Plan: Plan for discharge tomorrow, Breastfeeding, and Circumcision done  LOS: 1 day   01/02/22, MD 11/02/2021, 10:45 AM

## 2021-11-03 ENCOUNTER — Other Ambulatory Visit (HOSPITAL_COMMUNITY): Payer: Self-pay

## 2021-11-03 ENCOUNTER — Ambulatory Visit: Payer: Federal, State, Local not specified - PPO | Admitting: Cardiology

## 2021-11-03 MED ORDER — IBUPROFEN 600 MG PO TABS: 600.0000 mg | ORAL_TABLET | Freq: Four times a day (QID) | ORAL | Status: DC | PRN

## 2021-11-03 MED ORDER — ACETAMINOPHEN 325 MG PO TABS
650.0000 mg | ORAL_TABLET | ORAL | 0 refills | Status: DC | PRN
  Filled 2021-11-03: qty 30, 3d supply, fill #0

## 2021-11-03 MED ORDER — WITCH HAZEL-GLYCERIN EX PADS
1.0000 | MEDICATED_PAD | CUTANEOUS | 12 refills | Status: DC | PRN
  Filled 2021-11-03: qty 40, 30d supply, fill #0

## 2021-11-03 MED ORDER — BENZOCAINE-MENTHOL 20-0.5 % EX AERO
1.0000 | INHALATION_SPRAY | CUTANEOUS | 1 refills | Status: DC | PRN
  Filled 2021-11-03: qty 30, fill #0

## 2021-11-03 NOTE — Discharge Summary (Signed)
Postpartum Discharge Summary  Date of Service updated     Patient Name: Kimberly Callahan DOB: January 10, 1996 MRN: [id number]  Date of admission: 11/01/2021 Delivery date:11/01/2021  Delivering provider: Sanjuana Kava  Date of discharge: 11/03/2021  Admitting diagnosis: Normal labor and delivery [O80] Intrauterine pregnancy: [redacted]w[redacted]d    Secondary diagnosis:  Principal Problem:   Normal labor and delivery  Additional problems: n/a    Discharge diagnosis: Term Pregnancy Delivered                                              Post partum procedures: NA Augmentation: N/A Complications: None  Hospital course: Onset of Labor With Vaginal Delivery      26y.o. yo [id number] 28w3das admitted in Active Labor on 11/01/2021. Patient had an uncomplicated labor course as follows:  Membrane Rupture Time/Date: 4:45 AM ,11/01/2021   Delivery Method:Vaginal, Spontaneous  Episiotomy: None  Lacerations:  2nd degree;Perineal;Labial  Patient had an uncomplicated postpartum course.  She is ambulating, tolerating a regular diet, passing flatus, and urinating well. Patient is discharged home in stable condition on 11/03/21.  Newborn Data: Birth date:11/01/2021  Birth time:12:45 PM  Gender:Female  Living status:Living  Apgars:8 ,9  Weight:2670 g   Magnesium Sulfate received: No BMZ received: No Rhophylac:No MMR:No T-DaP:Given postpartum Flu: No Transfusion:No  Physical exam  Vitals:   11/02/21 0405 11/02/21 1530 11/02/21 2116 11/03/21 0528  BP: 118/80 112/73 116/72 125/83  Pulse: 94 83 86 85  Resp: '17 18 18 17  ' Temp: 98.3 F (36.8 C) 98 F (36.7 C) 98.3 F (36.8 C) 97.9 F (36.6 C)  TempSrc: Oral Oral Oral Oral  SpO2: 98%  97% 100%  Weight:      Height:       General: alert and cooperative Lochia: appropriate Uterine Fundus: soft Incision: N/A DVT Evaluation: Negative Homan's sign. Labs: Lab Results  Component Value Date   WBC 14.0 (H) 11/02/2021   HGB 10.2 (L) 11/02/2021   HCT 30.5 (L)  11/02/2021   MCV 92.1 11/02/2021   PLT 156 11/02/2021      Latest Ref Rng & Units 09/18/2021    7:09 PM  CMP  Glucose 70 - 99 mg/dL 193   BUN 6 - 20 mg/dL 13   Creatinine 0.44 - 1.00 mg/dL 0.73   Sodium 135 - 145 mmol/L 132   Potassium 3.5 - 5.1 mmol/L 3.1   Chloride 98 - 111 mmol/L 105   CO2 22 - 32 mmol/L 16   Calcium 8.9 - 10.3 mg/dL 8.5   Total Protein 6.5 - 8.1 g/dL 6.7   Total Bilirubin 0.3 - 1.2 mg/dL 1.1   Alkaline Phos 38 - 126 U/L 134   AST 15 - 41 U/L 31   ALT 0 - 44 U/L 14    Edinburgh Score:    11/01/2021    4:03 PM  Edinburgh Postnatal Depression Scale Screening Tool  I have been able to laugh and see the funny side of things. 0  I have looked forward with enjoyment to things. 0  I have blamed myself unnecessarily when things went wrong. 2  I have been anxious or worried for no good reason. 2  I have felt scared or panicky for no good reason. 0  Things have been getting on top of me. 0  I have been  so unhappy that I have had difficulty sleeping. 0  I have felt sad or miserable. 0  I have been so unhappy that I have been crying. 0  The thought of harming myself has occurred to me. 0  Edinburgh Postnatal Depression Scale Total 4      After visit meds:     Discharge home in stable condition Infant Feeding: Breast Infant Disposition:home with mother Discharge instruction: per After Visit Summary and Postpartum booklet. Activity: Advance as tolerated. Pelvic rest for 6 weeks.  Diet: routine diet Anticipated Birth Control: Nexplanon Postpartum Appointment:6 weeks Additional Postpartum F/U:  NONE Future Appointments:No future appointments. Follow up Visit:  [facility]. Schedule an appointment as soon as possible for a visit in 6 week(s).   Specialty: Obstetrics and Gynecology Contact information: [address] [place] [address] [facility] .   Specialty: Radiology Contact information: [address] [place]  [address] [phone]                    11/03/2021 Betsy Coder, MD

## 2021-11-11 ENCOUNTER — Telehealth (HOSPITAL_COMMUNITY): Payer: Self-pay | Admitting: *Deleted

## 2021-11-11 NOTE — Telephone Encounter (Signed)
Attempted Hospital Discharge Follow-Up Call.  Left voice mail requesting that patient return RN's phone call if patient has any concerns or questions regarding herself or her baby.  

## 2021-11-16 ENCOUNTER — Encounter: Payer: Self-pay | Admitting: *Deleted

## 2022-01-17 ENCOUNTER — Ambulatory Visit
Admission: EM | Admit: 2022-01-17 | Discharge: 2022-01-17 | Disposition: A | Discharge status: Home / Self Care | Payer: Federal, State, Local not specified - PPO | Attending: Internal Medicine | Admitting: Internal Medicine

## 2022-01-17 ENCOUNTER — Ambulatory Visit (INDEPENDENT_AMBULATORY_CARE_PROVIDER_SITE_OTHER): Payer: Federal, State, Local not specified - PPO

## 2022-01-17 DIAGNOSIS — S92502A Displaced unspecified fracture of left lesser toe(s), initial encounter for closed fracture: Secondary | ICD-10-CM

## 2022-01-17 DIAGNOSIS — M79675 Pain in left toe(s): Secondary | ICD-10-CM

## 2022-01-17 NOTE — Discharge Instructions (Signed)
Please follow-up with orthopedist at provided contact information.  Recommend ice application and elevation of extremity as well.

## 2022-01-17 NOTE — ED Provider Notes (Addendum)
[facility]    CSN: [id number] Arrival date & time: 01/17/22  1117      History   Chief Complaint Chief Complaint  Patient presents with   Toe Injury    HPI Kimberly Callahan is a 26 y.o. female.   Patient presents with left third toe pain after an injury that occurred yesterday.  Patient reports that she opened the refrigerator, and a bag of breastmilk fell on her toe.  She denies pain in any other toes or any other part of the foot.  Only having pain in the left third toe.  She reports that she does have some numbness and tingling sensation as well.  Has taken Tylenol with minimal improvement.  She reports bearing weight causes pain.     Past Medical History:  Diagnosis Date   Allergy     Patient Active Problem List   Diagnosis Date Noted   Status post vacuum-assisted vaginal delivery 12/02/2018   Postpartum care following vaginal delivery 9/7 12/02/2018   Perineal laceration, second degree 12/02/2018   Normal labor and delivery 12/01/2018   Hyperemesis gravidarum 10/20/2018   Shortness of breath due to pregnancy in third trimester 10/20/2018   Swelling of joint, knee, left 03/07/2015    Past Surgical History:  Procedure Laterality Date   TONSILECTOMY, ADENOIDECTOMY, BILATERAL MYRINGOTOMY AND TUBES  2006-7    OB History     Gravida  2   Para  2   Term  2   Preterm      AB      Living  2      SAB      IAB      Ectopic      Multiple  0   Live Births  2            Home Medications    Prior to Admission medications   Medication Sig Start Date End Date Taking? Authorizing Provider  acetaminophen (TYLENOL) 325 MG tablet Take 2 tablets (650 mg total) by mouth every 4 (four) hours as needed (for pain scale < 4  OR  temperature  >/=  100.5 F). 11/03/21   Dillard, Naima, MD  benzocaine-Menthol (DERMOPLAST) 20-0.5 % AERO Apply 1 Application topically as needed for irritation (perineal discomfort). 11/03/21   Crawford Givens, MD  Prenatal  Vit-Fe Fumarate-FA (PRENATAL PO) Take 2 tablets by mouth daily.    [provider]  rizatriptan (MAXALT-MLT) 10 MG disintegrating tablet Take 1 tablet earliest onset of headache.  May repeat in 2 hours if needed.  Maximum 2 tablets in 24 hours Patient not taking: Reported on 11/03/2021 02/06/21   Pieter Partridge, DO  topiramate (TOPAMAX) 50 MG tablet Take 1/2 tablet at bedtime for one week, then increase to 1 tablet at bedtime Patient not taking: Reported on 11/03/2021 02/06/21   Pieter Partridge, DO  witch hazel-glycerin (TUCKS) pad Apply 1 Application topically as needed for hemorrhoids. 11/03/21   Crawford Givens, MD    Family History Family History  Problem Relation Age of Onset   Hypertension Mother     Social History Social History   Tobacco Use   Smoking status: Never   Smokeless tobacco: Never  Vaping Use   Vaping Use: Never used  Substance Use Topics   Alcohol use: No    Alcohol/week: 0.0 standard drinks of alcohol   Drug use: No     Allergies   Latex, Penicillins, and Augmentin [amoxicillin-pot clavulanate]   Review of Systems  Review of Systems Per HPI  Physical Exam Triage Vital Signs ED Triage Vitals [01/17/22 1140]  Enc Vitals Group     BP 109/76     Pulse Rate 75     Resp 16     Temp 97.8 F (36.6 C)     Temp Source Oral     SpO2 98 %     Weight      Height      Head Circumference      Peak Flow      Pain Score 6     Pain Loc      Pain Edu?      Excl. in [place]?    No data found.  Updated Vital Signs BP 109/76 (BP Location: Left Arm)   Pulse 75   Temp 97.8 F (36.6 C) (Oral)   Resp 16   LMP 02/05/2021   SpO2 98%   Breastfeeding Yes   Visual Acuity Right Eye Distance:   Left Eye Distance:   Bilateral Distance:    Right Eye Near:   Left Eye Near:    Bilateral Near:     Physical Exam Constitutional:      General: She is not in acute distress.    Appearance: Normal appearance. She is not toxic-appearing or diaphoretic.  HENT:      Head: Normocephalic and atraumatic.  Eyes:     Extraocular Movements: Extraocular movements intact.     Conjunctiva/sclera: Conjunctivae normal.  Pulmonary:     Effort: Pulmonary effort is normal.  Feet:     Comments: Tenderness to palpation throughout the left third toe.  There is associated bruising erythema with mild swelling as well.  Majority of bruising discoloration is present to plantar surface of toe.  Patient can wiggle toes.  Capillary refill and pulses are intact. No lacerations or abrasions noted.  No signs of subungual hematoma. Neurological:     General: No focal deficit present.     Mental Status: She is alert and oriented to person, place, and time. Mental status is at baseline.  Psychiatric:        Mood and Affect: Mood normal.        Behavior: Behavior normal.        Thought Content: Thought content normal.        Judgment: Judgment normal.      UC Treatments / Results  Labs (all labs ordered are listed, but only abnormal results are displayed) Labs Reviewed - No data to display  EKG   Radiology DG Toe 3rd Left  Result Date: 01/17/2022 CLINICAL DATA:  Injury third toe yesterday EXAM: LEFT THIRD TOE 3 views COMPARISON:  None Available. FINDINGS: Fracture of the tuft of the distal third phalanx with mild displacement. No involvement of the D IP joint. IMPRESSION: Mildly displaced fracture tuft of the distal third phalanx. Electronically Signed   By: Franchot Gallo M.D.   On: 01/17/2022 12:09    Procedures Procedures (including critical care time)  Medications Ordered in UC Medications - No data to display  Initial Impression / Assessment and Plan / UC Course  I have reviewed the triage vital signs and the nursing notes.  Pertinent labs & imaging results that were available during my care of the patient were reviewed by me and considered in my medical decision making (see chart for details).     X-ray of left third toe is showing a mildly displaced  fracture of the phalanx of left third toe.  Will apply postop shoe and buddy tape here in urgent care.  Advised patient of supportive care including elevation of extremity and ice application.  Advised safe over-the-counter pain relievers as well.  Given that it is mildly displaced, I do think patient should see orthopedist for further evaluation and management but do not think that an emergent referral is necessary.  Patient provided with contact information for orthopedist for follow-up in the next few days.  Discussed return precautions.  Patient verbalized understanding and was agreeable with plan. Final Clinical Impressions(s) / UC Diagnoses   Final diagnoses:  Closed fracture of phalanx of left third toe, initial encounter     Discharge Instructions      Please follow-up with orthopedist at provided contact information.  Recommend ice application and elevation of extremity as well.     ED Prescriptions   None    PDMP not reviewed this encounter.   Teodora Medici, [place] 01/17/22 [place], [place], [place] 01/17/22 1253

## 2022-01-17 NOTE — ED Triage Notes (Signed)
Pt c/o bag falling onto left 3rd toe yesterday. New loss of sensation and bruising noted.

## 2022-06-23 IMAGING — CT CT HEAD W/O CM
3 series · 16 of 47 positions shown, 19 images · non-contrast
Comparison: None.

CLINICAL DATA: Headache.

EXAM:
CT HEAD WITHOUT CONTRAST
TECHNIQUE: Contiguous axial images were obtained from the base of the skull
through the vertex without intravenous contrast.

[Series 2: head wo · axial · 0.41mm/px · z∈[+1213,+1348]mm · 10 of 33 slices shown, 13 images]
[im 3/33  brain]
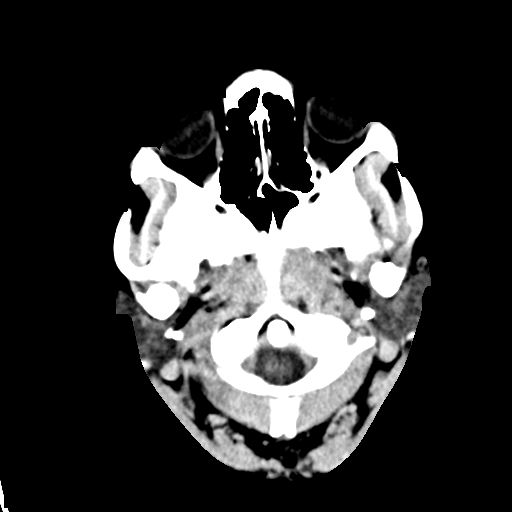
[im 3/33  bone]
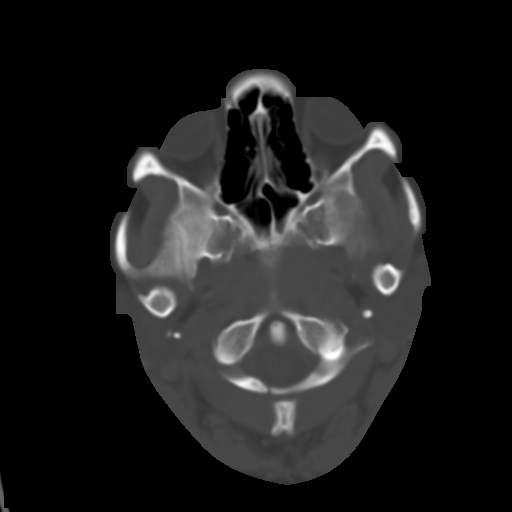
[im 6/33  brain]
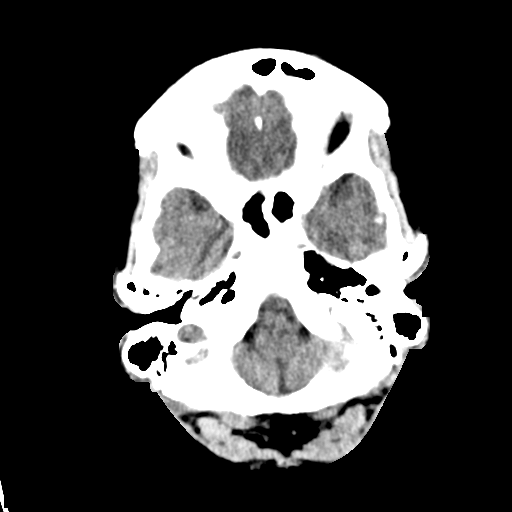
[im 9/33  brain]
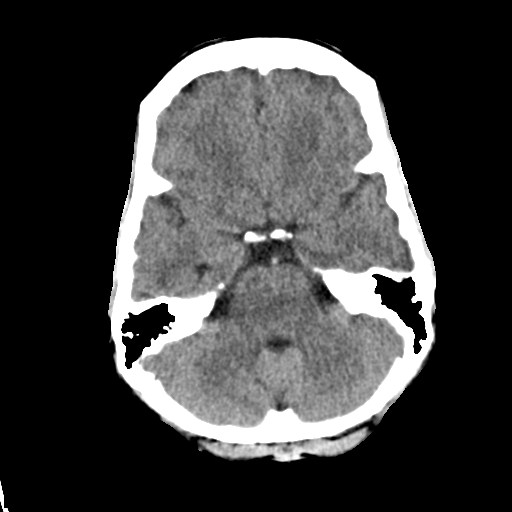
[im 12/33  brain]
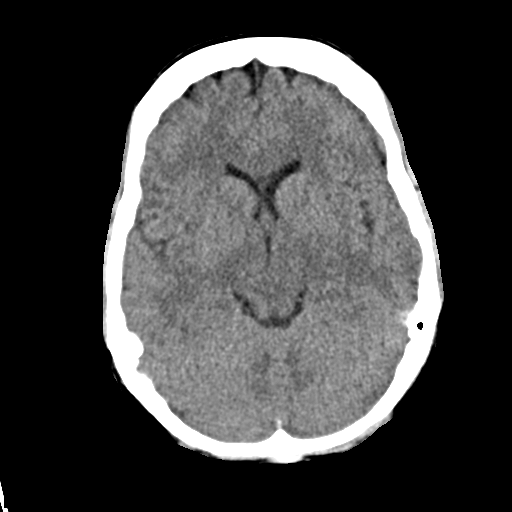
[im 15/33  brain]
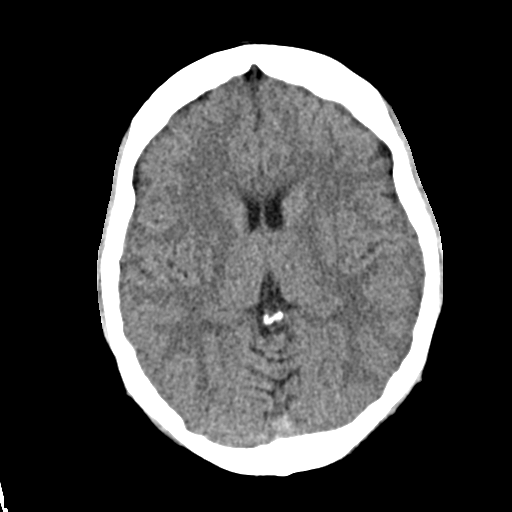
[im 15/33  bone]
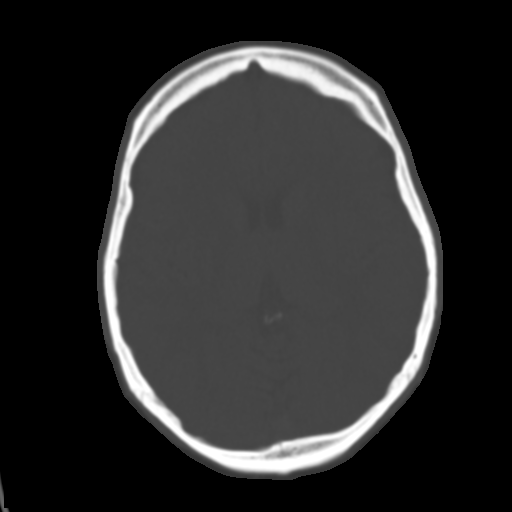
[im 18/33  brain]
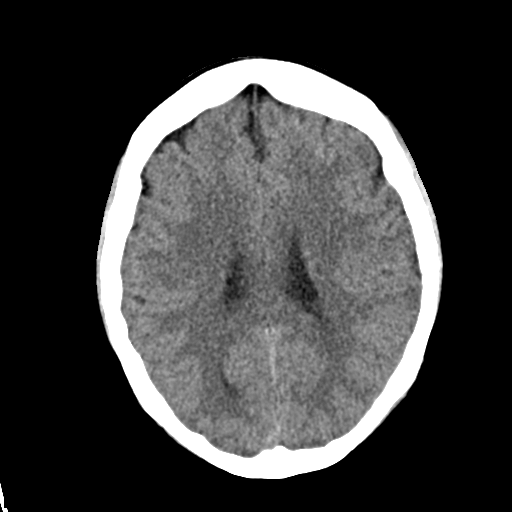
[im 21/33  brain]
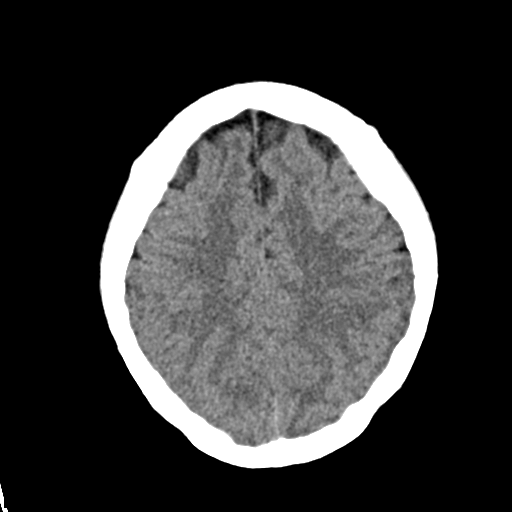
[im 25/33  brain]
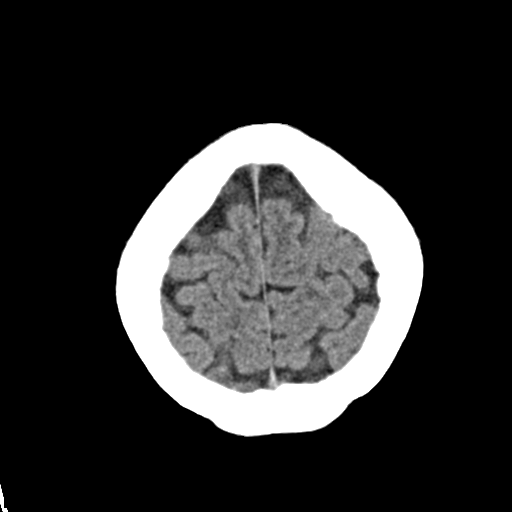
[im 27/33  brain]
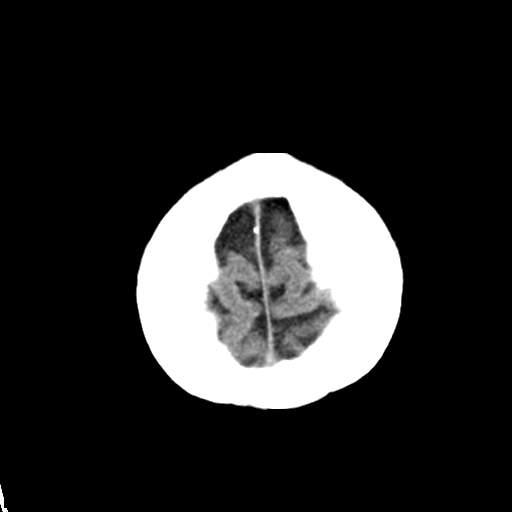
[im 27/33  bone]
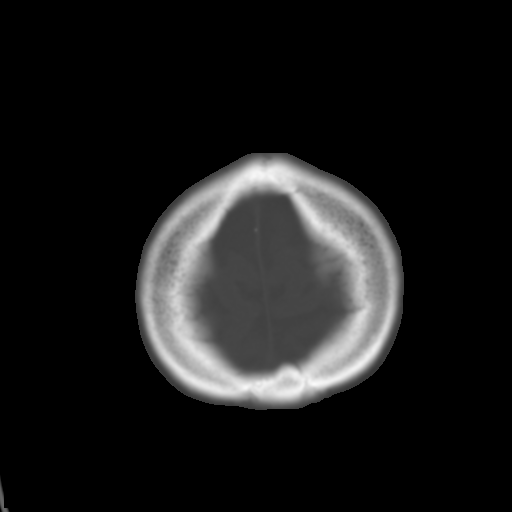
[im 30/33  brain]
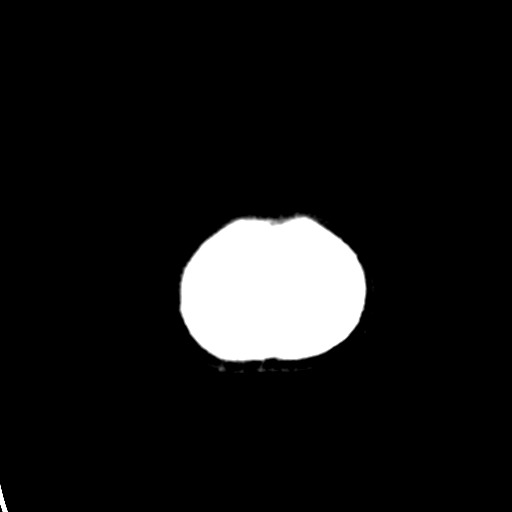

[Series 4: coronal soft · coronal · 0.33mm/px · 3 of 69 slices shown]
[im 25/69  brain]
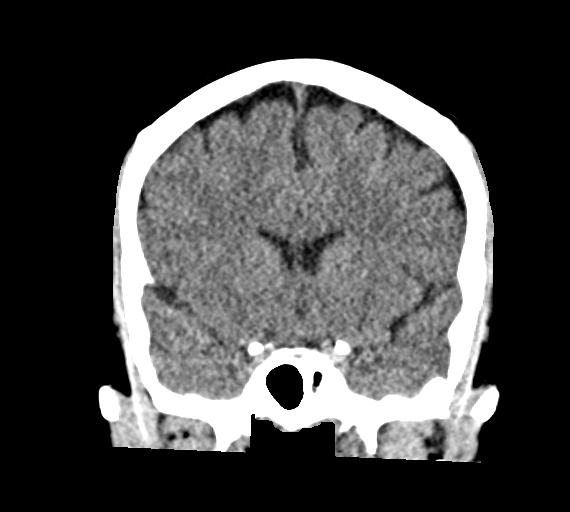
[im 31/69  brain]
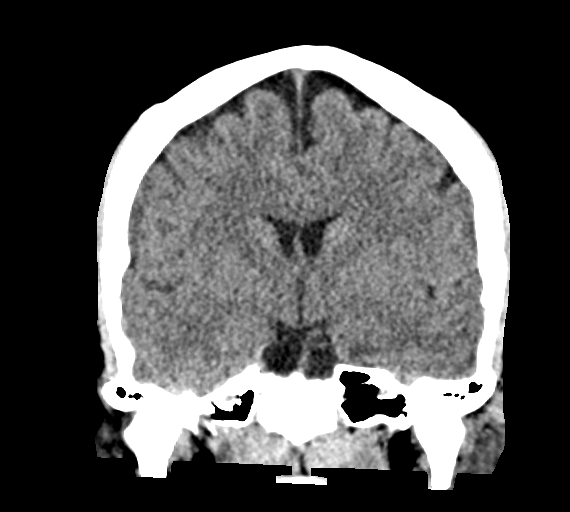
[im 38/69  brain]
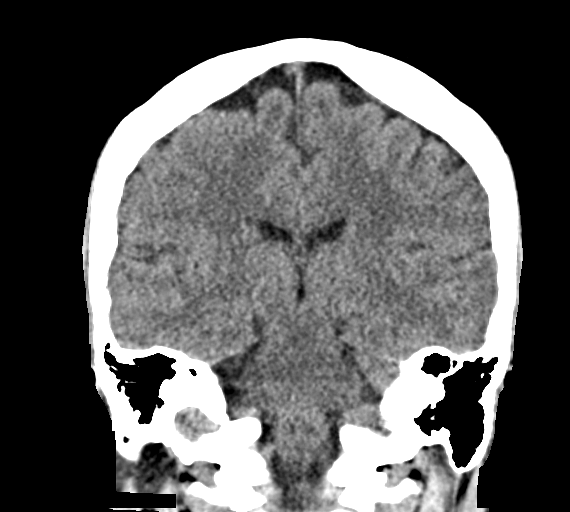

[Series 5: sag soft · sagittal · 0.35mm/px · 3 of 64 slices shown]
[im 22/64  brain]
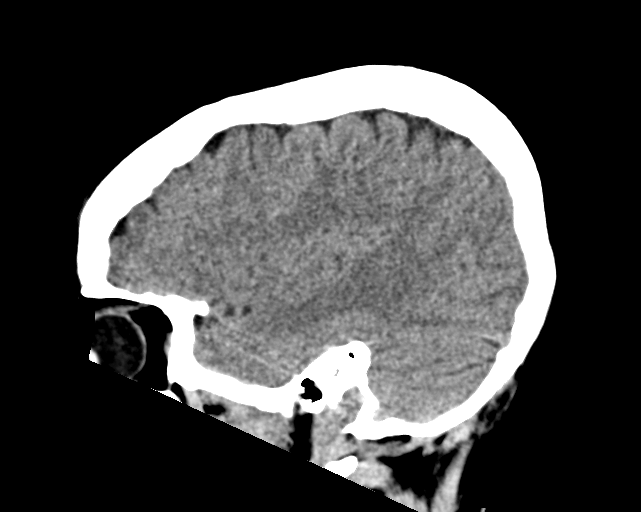
[im 32/64  brain]
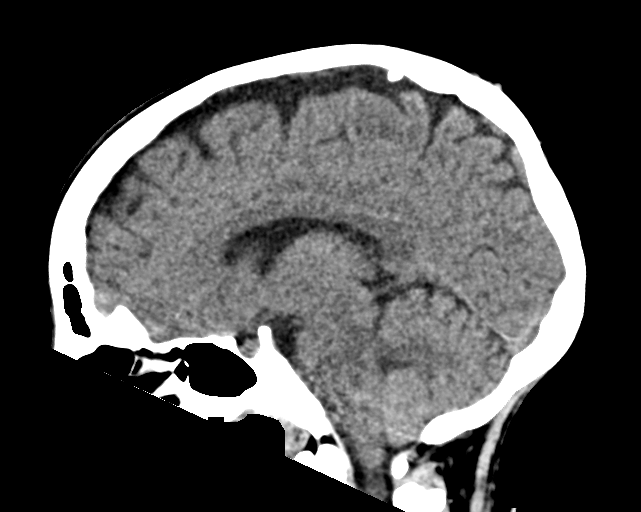
[im 43/64  brain]
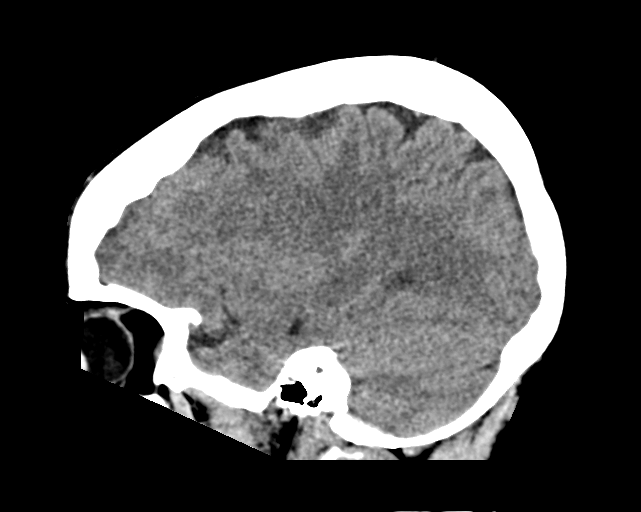

[16 of 47 positions shown; findings below may reference images not displayed]

FINDINGS: Brain: No evidence of acute infarction, hemorrhage, hydrocephalus,
extra-axial collection or mass lesion/mass effect.

Vascular: No hyperdense vessel or unexpected calcification.

Skull: Normal. Negative for fracture or focal lesion.

Sinuses/Orbits: No acute finding.

Other: None
IMPRESSION: Normal noncontrast CT of the brain.

## 2022-08-01 IMAGING — US US OB < 14 WEEKS - US OB TV
1 series · 14 of 28 positions shown · non-contrast
Comparison: None.

CLINICAL DATA: Vaginal bleeding

EXAM:
OBSTETRIC <14 WK US AND TRANSVAGINAL OB US
TECHNIQUE: Both transabdominal and transvaginal ultrasound examinations were
performed for complete evaluation of the gestation as well as the
maternal uterus, adnexal regions, and pelvic cul-de-sac.
Transvaginal technique was performed to assess early pregnancy.

[Series 1: us ob < 14 weeks - us ob tv · 14 of 48 slices shown]
[im 2/48]
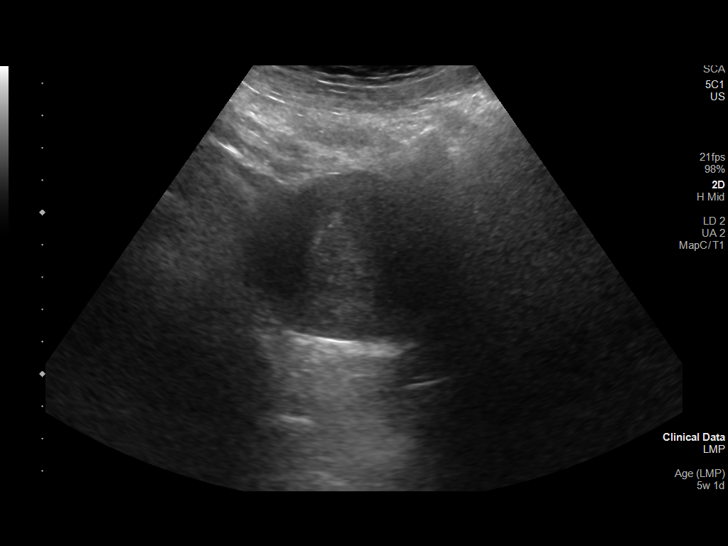
[im 6/48]
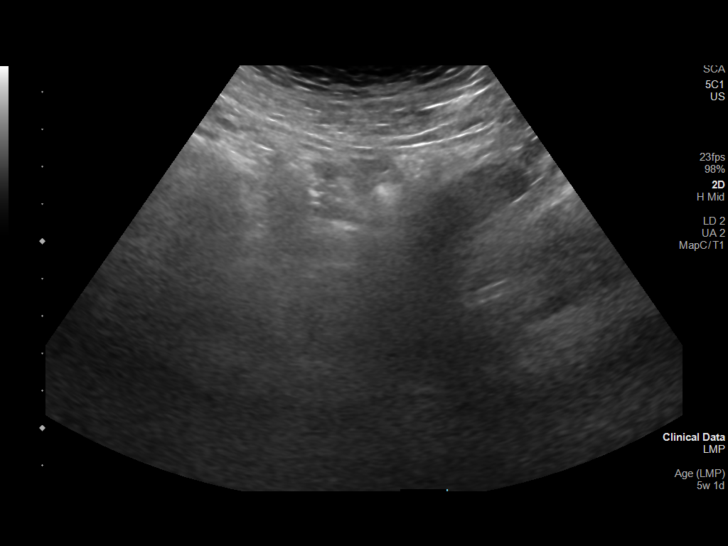
[im 9/48]
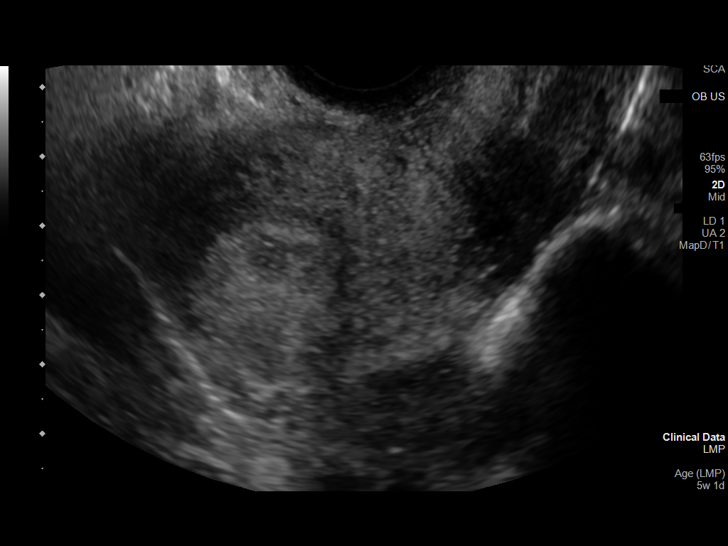
[im 13/48]
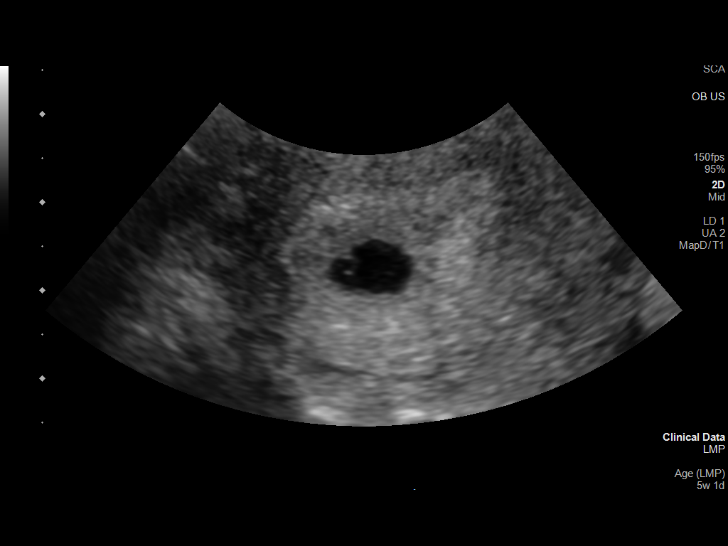
[im 16/48]
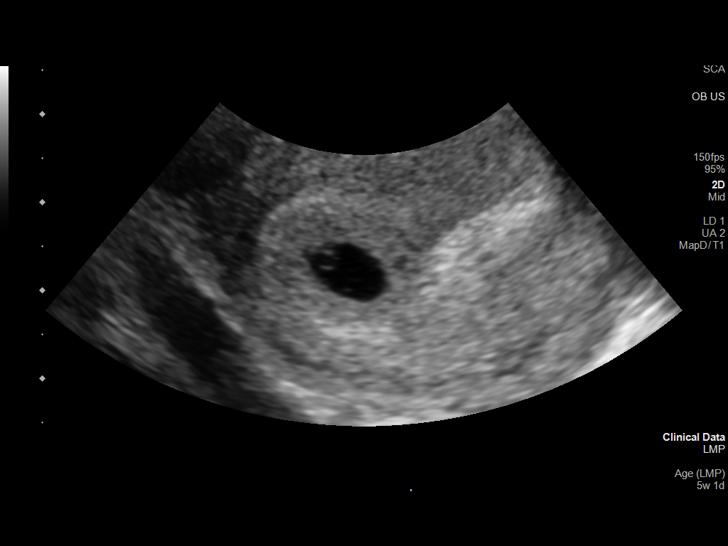
[im 20/48]
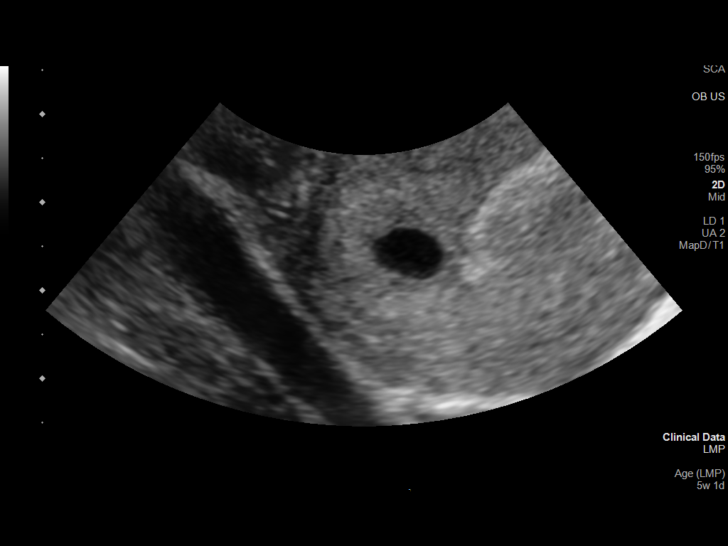
[im 23/48]
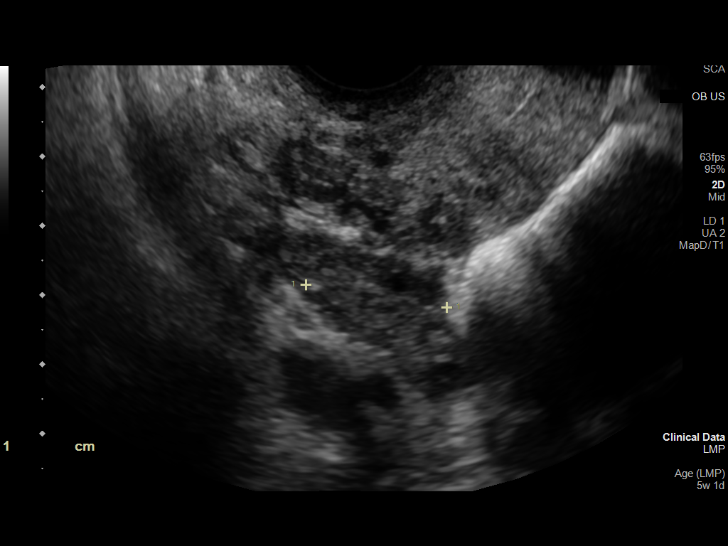
[im 27/48]
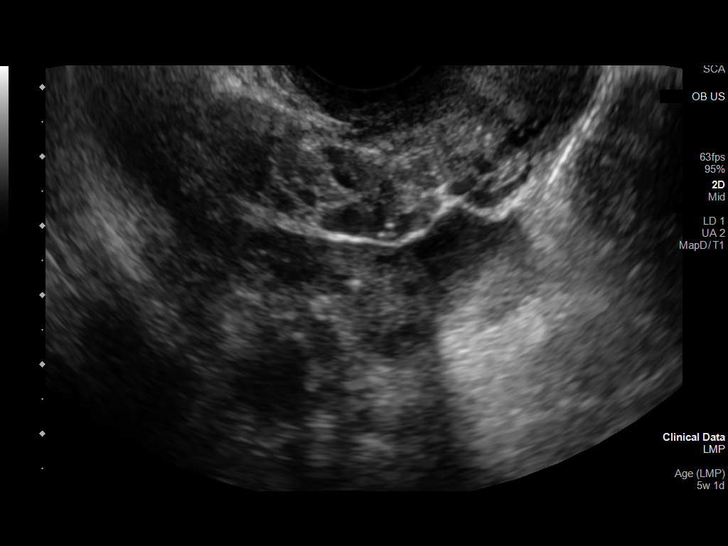
[im 30/48]
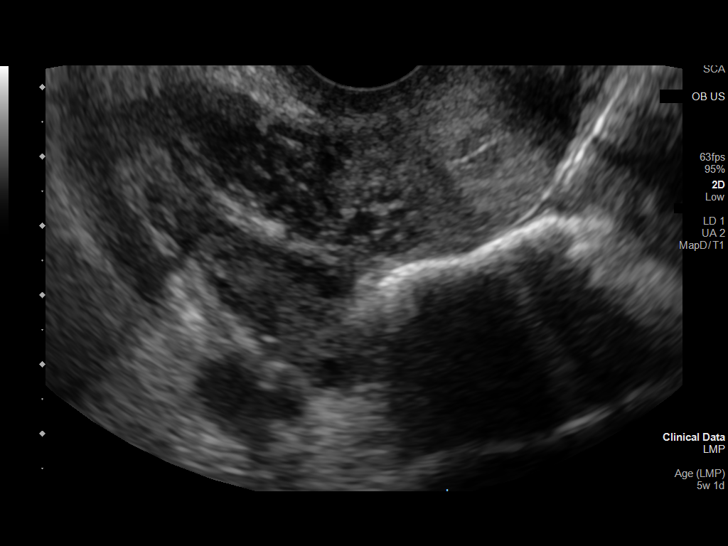
[im 34/48]
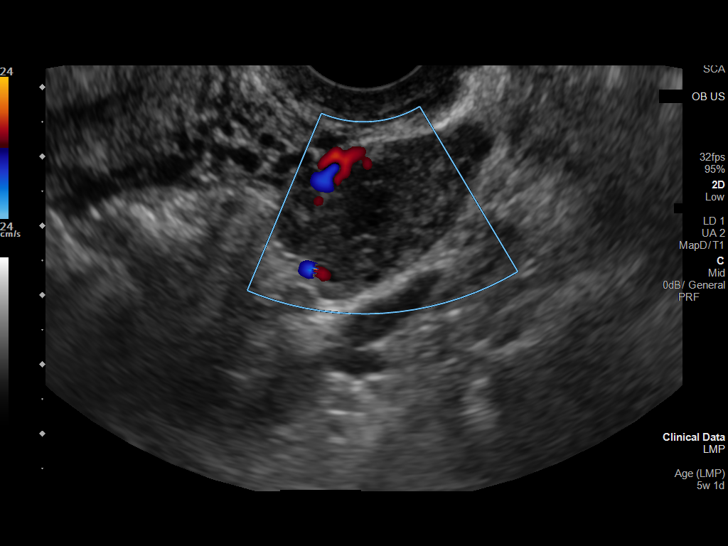
[im 37/48]
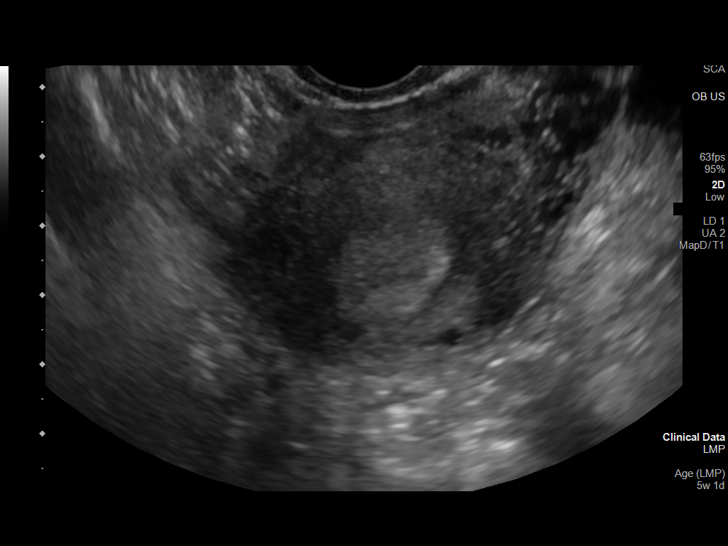
[im 41/48]
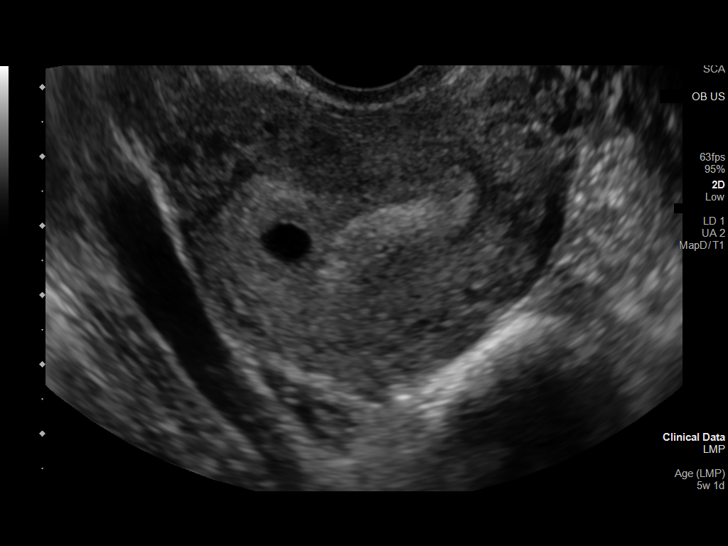
[im 44/48]
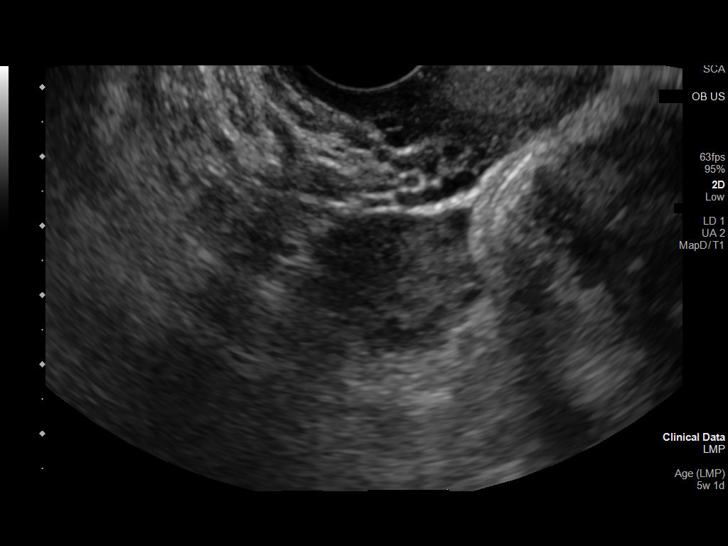
[im 48/48]
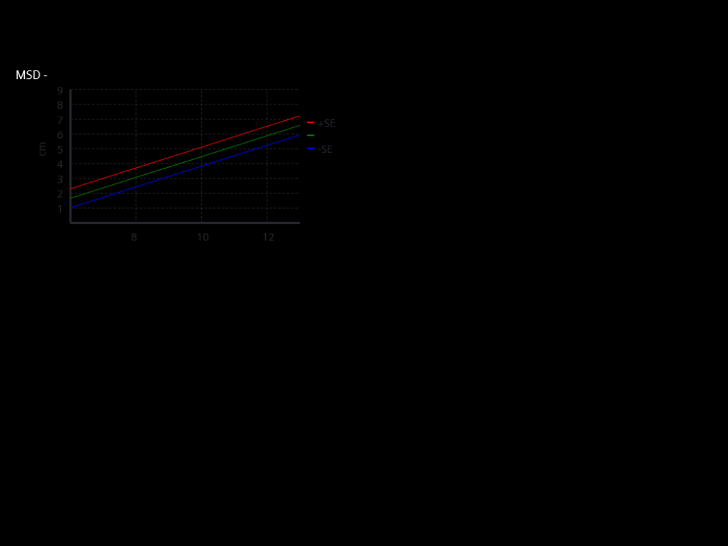

[14 of 28 positions shown; findings below may reference images not displayed]

FINDINGS: Intrauterine gestational sac: Single

Yolk sac:  Visualized.

Embryo:  Not Visualized.

Cardiac Activity: Not Visualized.

MSD: 9 mm   5 w   5 d

Subchorionic hemorrhage:  None visualized.

Maternal uterus/adnexae: Unremarkable.
IMPRESSION: Intrauterine gestational sac which correlates with 5 week 5 day
gestation by size. No fetal pole identified, possibly secondary to
early pregnancy. Consider follow-up ultrasound in 10-14 days.

## 2023-10-21 ENCOUNTER — Other Ambulatory Visit (HOSPITAL_BASED_OUTPATIENT_CLINIC_OR_DEPARTMENT_OTHER): Payer: Self-pay

## 2023-10-21 MED ORDER — NITROFURANTOIN MONOHYD MACRO 100 MG PO CAPS
100.0000 mg | ORAL_CAPSULE | Freq: Two times a day (BID) | ORAL | 0 refills | Status: DC
  Filled 2023-10-21: qty 10, 5d supply, fill #0

## 2023-12-17 NOTE — Progress Notes (Unsigned)
 NEUROLOGY FOLLOW UP OFFICE NOTE  Kimberly Callahan [id number]  Assessment/Plan:   Migraine without aura, without status migrainosus, intractable vs cluster headache, intractable - autonomic features suggestive of cluster however headache and symptoms are bilateral and autonomic symptoms may be seen in migraine as well  Migraine prevention:  *** Migraine rescue:  *** Lifestyle modification: Limit use of pain relievers to no more than 9 days out of the month to prevent risk of rebound or medication-overuse headache. Diet modification/hydration/caffeine cessation Routine exercise Sleep hygiene Consider vitamins/supplements:  magnesium citrate 400mg  daily, riboflavin 400mg  daily, CoQ10 100mg  three times daily Keep headache diary Follow up ***     Subjective:  Kimberly Callahan is a 28 year old female who follows up for migraines.  UPDATE: Last seen in initial consultation in November 2022.  At that time, she was prescribed topiramate , rizatriptan  and Zofran .  Intensity:  *** Duration:  *** Frequency:  *** Frequency of abortive medication: *** Current NSAIDS/analgesics:  Excedrin, Tylenol  Current triptans:  none Current ergotamine:  none Current anti-emetic:  none Current muscle relaxants:  none Current Antihypertensive medications:  none Current Antidepressant medications:  none Current Anticonvulsant medications:  none Current anti-CGRP:  none Current Vitamins/Herbal/Supplements:  none Current Antihistamines/Decongestants:  none Other therapy:  none Hormone/birth control:  none  Caffeine:  no  Depression:  no; Anxiety:  no Other pain:  no Sleep hygiene:  trouble falling asleep -mind races  HISTORY: Onset:  28 years old Location:  starts left temporal radiating to bilateral retro-orbital and band-like Quality:  pounding, pressure Intensity:  8/10 Aura:  absent Prodrome:  absent Associated symptoms:  Nausea, photophobia, dizziness, bilateral conjunctival injections,  bilateral eye lacrimation, rhinorrhea, rarely vomiting.  She denies associated visual changes, or unilateral numbness or weakness. Duration:  2 1/2 to 3 days Frequency:  over past year 1-2 times a week Frequency of abortive medication: Tylenol  or Excedrin 2-3 days a week Triggers:  Nexplanon , pregnancy Relieving factors:  Sleep Activity:  movement aggravates  She presented to the ED on 02/02/2021 due to experiencing change in migraine, presenting with right-sided tinnitus.  CT head personally reviewed was normal.  She was treated with a headache cocktail.  Past NSAIDS/analgesics:  Midol  Past abortive triptans:  none Past abortive ergotamine:  none Past muscle relaxants:  none Past anti-emetic:  none Past antihypertensive medications:  none Past antidepressant medications:  none Past anticonvulsant medications:  none Past anti-CGRP:  none Past vitamins/Herbal/Supplements:  none Past antihistamines/decongestants:  none Other past therapies:  none   Family history of headache:  no  PAST MEDICAL HISTORY: Past Medical History:  Diagnosis Date   Allergy     MEDICATIONS: Current Outpatient Medications on File Prior to Visit  Medication Sig Dispense Refill   acetaminophen  (TYLENOL ) 325 MG tablet Take 2 tablets (650 mg total) by mouth every 4 (four) hours as needed (for pain scale < 4  OR  temperature  >/=  100.5 F). 30 tablet 0   benzocaine -Menthol  (DERMOPLAST) 20-0.5 % AERO Apply 1 Application topically as needed for irritation (perineal discomfort). 30 g 1   nitrofurantoin , macrocrystal-monohydrate, (MACROBID ) 100 MG capsule Take 1 capsule (100 mg total) by mouth 2 (two) times daily. 10 capsule 0   Prenatal Vit-Fe Fumarate-FA (PRENATAL PO) Take 2 tablets by mouth daily.     rizatriptan  (MAXALT -MLT) 10 MG disintegrating tablet Take 1 tablet earliest onset of headache.  May repeat in 2 hours if needed.  Maximum 2 tablets in 24 hours (Patient not taking:  Reported on 11/03/2021) 10 tablet  5   topiramate  (TOPAMAX ) 50 MG tablet Take 1/2 tablet at bedtime for one week, then increase to 1 tablet at bedtime (Patient not taking: Reported on 11/03/2021) 45 tablet 0   witch hazel-glycerin  (TUCKS) pad Apply 1 Application topically as needed for hemorrhoids. 40 each 12   No current facility-administered medications on file prior to visit.    ALLERGIES: Allergies  Allergen Reactions   Latex Rash and Hives   Penicillins Hives and Rash   Augmentin  [Amoxicillin -Pot Clavulanate] Hives    FAMILY HISTORY: Family History  Problem Relation Age of Onset   Hypertension Mother       Objective:  *** General: No acute distress.  Patient appears ***-groomed.   Head:  Normocephalic/atraumatic Eyes:  Fundi examined but not visualized Neck: supple, no paraspinal tenderness, full range of motion Heart:  Regular rate and rhythm Neurological Exam: alert and oriented.  Speech fluent and not dysarthric, language intact.  CN II-XII intact. Bulk and tone normal, muscle strength 5/5 throughout.  Sensation to light touch intact.  Deep tendon reflexes 2+ throughout, toes downgoing.  Finger to nose testing intact.  Gait normal, Romberg negative.   Juliene Dunnings, DO  CC: ***

## 2023-12-18 ENCOUNTER — Ambulatory Visit (INDEPENDENT_AMBULATORY_CARE_PROVIDER_SITE_OTHER): Admitting: Neurology

## 2023-12-18 ENCOUNTER — Encounter: Payer: Self-pay | Admitting: Neurology

## 2023-12-18 VITALS — BP 115/81 | HR 88 | Resp 20 | Ht 64.0 in | Wt 156.0 lb

## 2023-12-18 DIAGNOSIS — G43719 Chronic migraine without aura, intractable, without status migrainosus: Secondary | ICD-10-CM

## 2023-12-18 MED ORDER — TOPIRAMATE 25 MG PO TABS: ORAL_TABLET | ORAL | 0 refills | Status: AC

## 2023-12-18 MED ORDER — SUMATRIPTAN SUCCINATE 50 MG PO TABS: 50.0000 mg | ORAL_TABLET | ORAL | 5 refills | Status: AC | PRN

## 2023-12-18 MED ORDER — ONDANSETRON HCL 4 MG PO TABS: 4.0000 mg | ORAL_TABLET | Freq: Three times a day (TID) | ORAL | 5 refills | Status: AC | PRN

## 2023-12-18 NOTE — Patient Instructions (Addendum)
  MRI of brain with and without contrast Start topiramate  25mg  - take 1 pill at bedtime for one week, then increase to 2 pills at bedtime.  Contact us  in 5 weeks with update and we can increase dose if needed. Take sumatriptan  50mg  at earliest onset of headache.  May repeat dose once in 2 hours if needed.  Maximum 2 tablets in 24 hours. Take ondansetron  for nausea as needed Stop Excedrin.  Either cold [place] or taper off by one day every week until done Limit use of pain relievers to no more than 9 days out of the month.  These medications include acetaminophen , NSAIDs (ibuprofen /Advil /Motrin , naproxen /Aleve , triptans (Imitrex /sumatriptan ), Excedrin, and narcotics.  This will help reduce risk of rebound headaches. Be aware of common food triggers Routine exercise Stay adequately hydrated (aim for 64 oz water daily) Keep headache diary Maintain proper stress management Maintain proper sleep hygiene Do not skip meals Consider supplements:  magnesium citrate 400mg  daily, riboflavin 400mg  daily, coenzyme Q10 100mg  three times daily.

## 2024-01-21 ENCOUNTER — Encounter: Payer: Self-pay | Admitting: Neurology

## 2024-01-22 ENCOUNTER — Encounter: Payer: Self-pay | Admitting: Neurology

## 2024-01-23 ENCOUNTER — Other Ambulatory Visit

## 2024-02-07 ENCOUNTER — Other Ambulatory Visit

## 2024-02-18 ENCOUNTER — Inpatient Hospital Stay: Admission: RE | Admit: 2024-02-18 | Source: Ambulatory Visit

## 2024-07-06 ENCOUNTER — Ambulatory Visit: Admitting: Neurology
